# Patient Record
Sex: Male | Born: 1975
Health system: Southern US, Community
[De-identification: ages and names within clinical notes are randomized; demographics above are authoritative.]

## PROBLEM LIST (undated history)

## (undated) DIAGNOSIS — M549 Dorsalgia, unspecified: Secondary | ICD-10-CM

## (undated) DIAGNOSIS — J40 Bronchitis, not specified as acute or chronic: Secondary | ICD-10-CM

## (undated) DIAGNOSIS — J329 Chronic sinusitis, unspecified: Secondary | ICD-10-CM

---

## 2004-10-27 ENCOUNTER — Emergency Department (HOSPITAL_COMMUNITY): Admission: EM | Admit: 2004-10-27 | Discharge: 2004-10-27 | Payer: Self-pay | Admitting: Emergency Medicine

## 2005-08-09 ENCOUNTER — Emergency Department (HOSPITAL_COMMUNITY): Admission: EM | Admit: 2005-08-09 | Discharge: 2005-08-09 | Payer: Self-pay | Admitting: Emergency Medicine

## 2006-02-08 ENCOUNTER — Emergency Department (HOSPITAL_COMMUNITY): Admission: EM | Admit: 2006-02-08 | Discharge: 2006-02-08 | Payer: Self-pay | Admitting: Emergency Medicine

## 2007-06-14 ENCOUNTER — Emergency Department (HOSPITAL_COMMUNITY): Admission: EM | Admit: 2007-06-14 | Discharge: 2007-06-14 | Payer: Self-pay | Admitting: Emergency Medicine

## 2007-08-08 ENCOUNTER — Emergency Department (HOSPITAL_COMMUNITY): Admission: EM | Admit: 2007-08-08 | Discharge: 2007-08-08 | Payer: Self-pay | Admitting: Emergency Medicine

## 2008-01-20 ENCOUNTER — Emergency Department (HOSPITAL_COMMUNITY): Admission: EM | Admit: 2008-01-20 | Discharge: 2008-01-20 | Payer: Self-pay | Admitting: Emergency Medicine

## 2008-02-03 ENCOUNTER — Emergency Department (HOSPITAL_COMMUNITY): Admission: EM | Admit: 2008-02-03 | Discharge: 2008-02-03 | Payer: Self-pay | Admitting: Emergency Medicine

## 2008-02-10 ENCOUNTER — Emergency Department (HOSPITAL_COMMUNITY): Admission: EM | Admit: 2008-02-10 | Discharge: 2008-02-10 | Payer: Self-pay | Admitting: Emergency Medicine

## 2008-02-11 ENCOUNTER — Emergency Department (HOSPITAL_COMMUNITY): Admission: EM | Admit: 2008-02-11 | Discharge: 2008-02-11 | Payer: Self-pay | Admitting: Emergency Medicine

## 2008-02-21 ENCOUNTER — Emergency Department (HOSPITAL_COMMUNITY): Admission: EM | Admit: 2008-02-21 | Discharge: 2008-02-21 | Payer: Self-pay | Admitting: Emergency Medicine

## 2008-04-27 ENCOUNTER — Emergency Department (HOSPITAL_COMMUNITY): Admission: EM | Admit: 2008-04-27 | Discharge: 2008-04-27 | Payer: Self-pay | Admitting: Emergency Medicine

## 2008-06-14 ENCOUNTER — Emergency Department (HOSPITAL_COMMUNITY): Admission: EM | Admit: 2008-06-14 | Discharge: 2008-06-14 | Payer: Self-pay | Admitting: Emergency Medicine

## 2008-07-02 ENCOUNTER — Emergency Department (HOSPITAL_COMMUNITY): Admission: EM | Admit: 2008-07-02 | Discharge: 2008-07-02 | Payer: Self-pay | Admitting: Emergency Medicine

## 2008-09-07 ENCOUNTER — Emergency Department (HOSPITAL_COMMUNITY): Admission: EM | Admit: 2008-09-07 | Discharge: 2008-09-07 | Payer: Self-pay | Admitting: Emergency Medicine

## 2008-11-28 ENCOUNTER — Emergency Department (HOSPITAL_COMMUNITY): Admission: EM | Admit: 2008-11-28 | Discharge: 2008-11-28 | Payer: Self-pay | Admitting: Emergency Medicine

## 2008-12-01 ENCOUNTER — Emergency Department (HOSPITAL_COMMUNITY): Admission: EM | Admit: 2008-12-01 | Discharge: 2008-12-01 | Payer: Self-pay | Admitting: Emergency Medicine

## 2008-12-14 ENCOUNTER — Emergency Department (HOSPITAL_BASED_OUTPATIENT_CLINIC_OR_DEPARTMENT_OTHER): Admission: EM | Admit: 2008-12-14 | Discharge: 2008-12-14 | Payer: Self-pay | Admitting: Emergency Medicine

## 2009-02-12 ENCOUNTER — Emergency Department (HOSPITAL_COMMUNITY): Admission: EM | Admit: 2009-02-12 | Discharge: 2009-02-12 | Payer: Self-pay | Admitting: Emergency Medicine

## 2009-05-15 ENCOUNTER — Emergency Department (HOSPITAL_BASED_OUTPATIENT_CLINIC_OR_DEPARTMENT_OTHER): Admission: EM | Admit: 2009-05-15 | Discharge: 2009-05-16 | Payer: Self-pay | Admitting: Emergency Medicine

## 2009-05-15 ENCOUNTER — Ambulatory Visit: Payer: Self-pay | Admitting: Diagnostic Radiology

## 2009-05-16 ENCOUNTER — Ambulatory Visit: Payer: Self-pay | Admitting: Diagnostic Radiology

## 2009-06-15 ENCOUNTER — Ambulatory Visit: Payer: Self-pay | Admitting: Diagnostic Radiology

## 2009-06-15 ENCOUNTER — Emergency Department (HOSPITAL_BASED_OUTPATIENT_CLINIC_OR_DEPARTMENT_OTHER): Admission: EM | Admit: 2009-06-15 | Discharge: 2009-06-15 | Payer: Self-pay | Admitting: Emergency Medicine

## 2009-09-10 ENCOUNTER — Emergency Department (HOSPITAL_BASED_OUTPATIENT_CLINIC_OR_DEPARTMENT_OTHER): Admission: EM | Admit: 2009-09-10 | Discharge: 2009-09-10 | Payer: Self-pay | Admitting: Emergency Medicine

## 2009-09-12 ENCOUNTER — Emergency Department (HOSPITAL_COMMUNITY): Admission: EM | Admit: 2009-09-12 | Discharge: 2009-09-12 | Payer: Self-pay | Admitting: Emergency Medicine

## 2009-09-26 ENCOUNTER — Ambulatory Visit: Payer: Self-pay | Admitting: Diagnostic Radiology

## 2009-09-26 ENCOUNTER — Emergency Department (HOSPITAL_BASED_OUTPATIENT_CLINIC_OR_DEPARTMENT_OTHER): Admission: EM | Admit: 2009-09-26 | Discharge: 2009-09-26 | Payer: Self-pay | Admitting: Emergency Medicine

## 2009-10-12 ENCOUNTER — Emergency Department (HOSPITAL_COMMUNITY): Admission: EM | Admit: 2009-10-12 | Discharge: 2009-10-12 | Payer: Self-pay | Admitting: Family Medicine

## 2009-11-20 IMAGING — CR DG CHEST 2V
2 series · 2 of 2 positions shown · non-contrast
Comparison: Chest x-ray of 08/08/2007

CLINICAL DATA: Cough, fever, congestion

CHEST - 2 VIEW

[w chest pa]
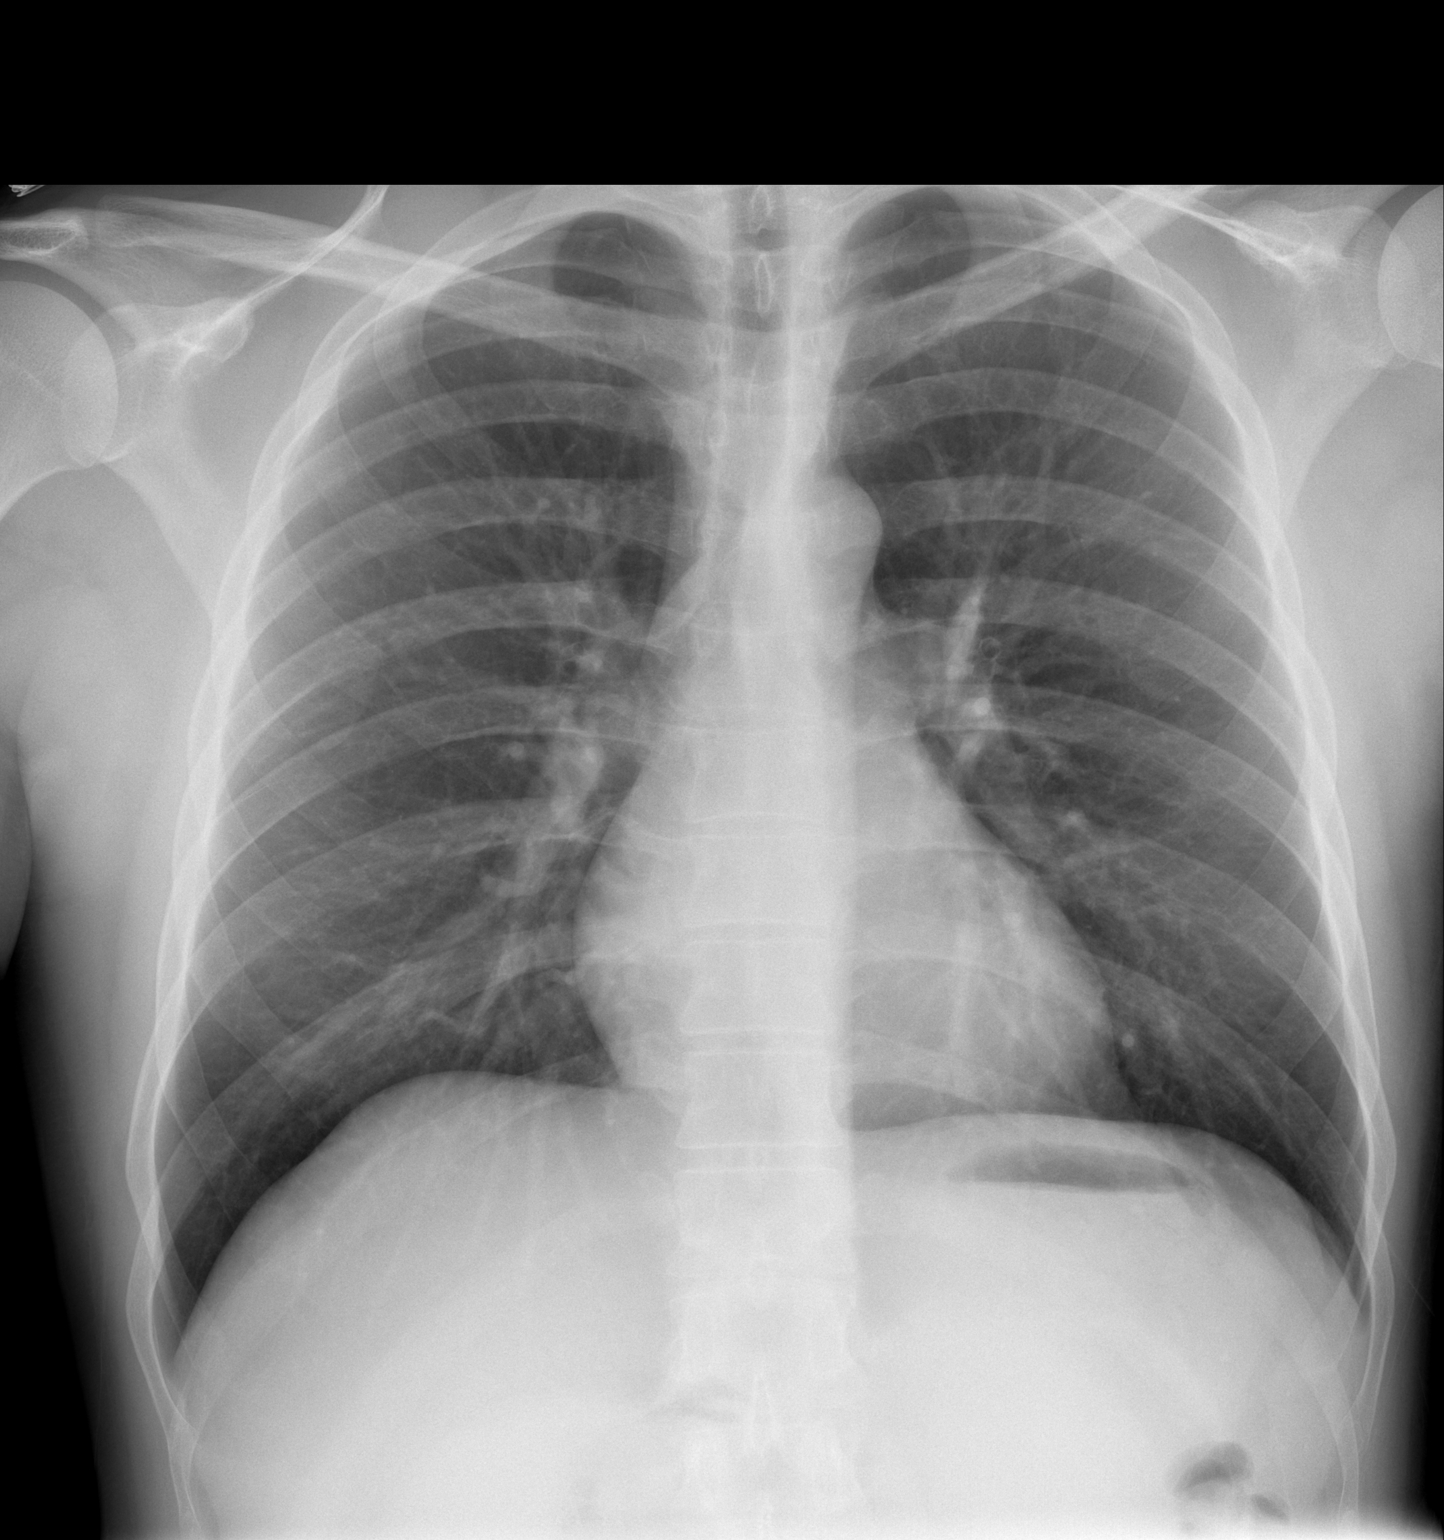

[w chest lat]
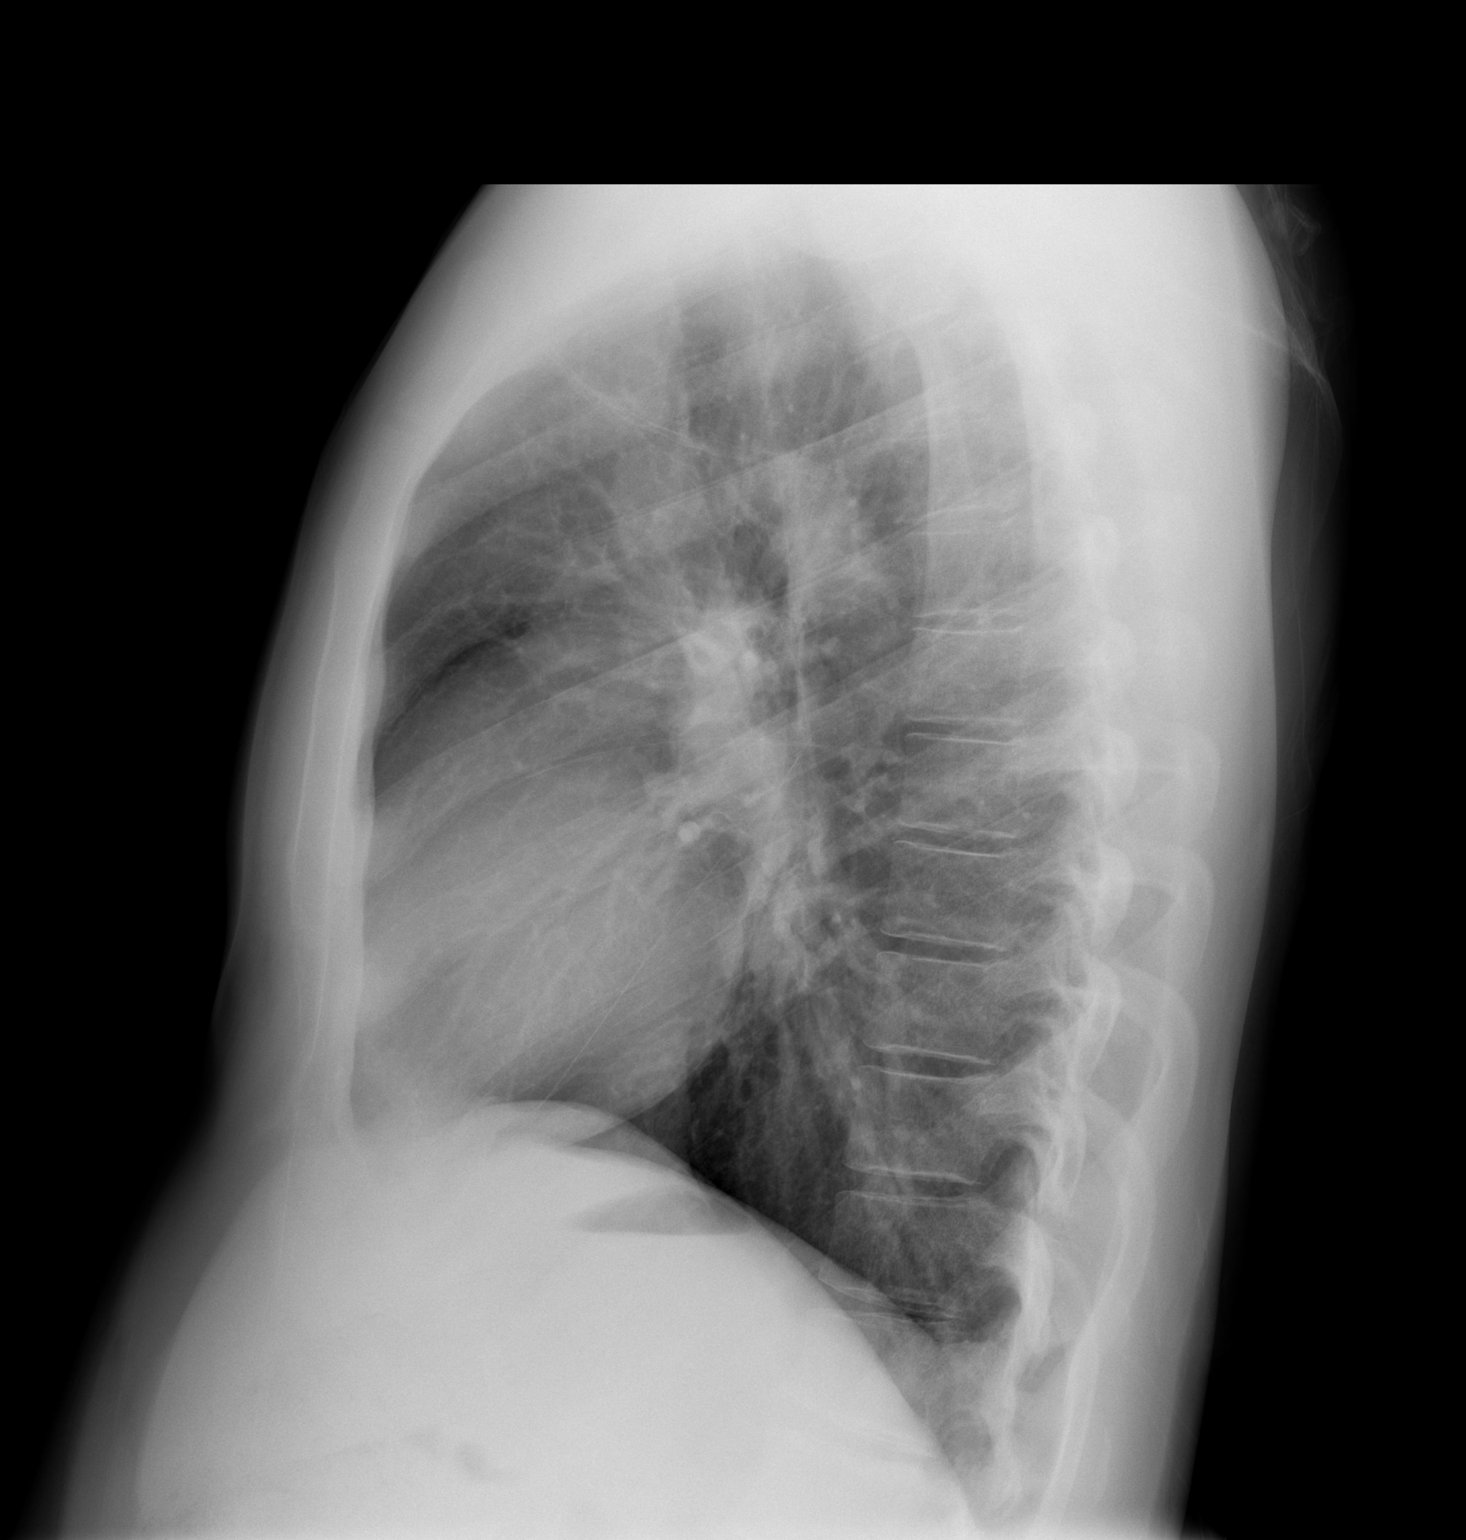

[2 of 2 positions shown; findings below may reference images not displayed]

FINDINGS: The lungs are clear and slightly hyperaerated.  The heart
is within normal limits in size.  No acute bony abnormality is
seen.
IMPRESSION: Stable chest x-ray.  No active lung disease.

## 2010-05-20 ENCOUNTER — Emergency Department (HOSPITAL_BASED_OUTPATIENT_CLINIC_OR_DEPARTMENT_OTHER): Admission: EM | Admit: 2010-05-20 | Discharge: 2010-05-20 | Payer: Self-pay | Admitting: Emergency Medicine

## 2011-01-28 LAB — RAPID STREP SCREEN (MED CTR MEBANE ONLY): Streptococcus, Group A Screen (Direct): NEGATIVE

## 2011-02-01 LAB — URINALYSIS, ROUTINE W REFLEX MICROSCOPIC
Bilirubin Urine: NEGATIVE
Glucose, UA: NEGATIVE mg/dL
Hgb urine dipstick: NEGATIVE
Ketones, ur: NEGATIVE mg/dL
Nitrite: NEGATIVE
Protein, ur: NEGATIVE mg/dL
Specific Gravity, Urine: 1.02 (ref 1.005–1.030)
Urobilinogen, UA: 0.2 mg/dL (ref 0.0–1.0)
pH: 6 (ref 5.0–8.0)

## 2011-02-02 LAB — BASIC METABOLIC PANEL
BUN: 11 mg/dL (ref 6–23)
CO2: 30 mEq/L (ref 19–32)
Calcium: 9.3 mg/dL (ref 8.4–10.5)
Chloride: 104 mEq/L (ref 96–112)
Creatinine, Ser: 0.7 mg/dL (ref 0.4–1.5)
GFR calc Af Amer: >60 mL/min (ref >60)
GFR calc non Af Amer: >60 mL/min (ref >60)
Glucose, Bld: 93 mg/dL (ref 70–99)
Potassium: 4 mEq/L (ref 3.5–5.1)
Sodium: 142 mEq/L (ref 135–145)

## 2011-02-02 LAB — URINALYSIS, ROUTINE W REFLEX MICROSCOPIC
Bilirubin Urine: NEGATIVE
Glucose, UA: NEGATIVE mg/dL
Hgb urine dipstick: NEGATIVE
Ketones, ur: NEGATIVE mg/dL
Nitrite: NEGATIVE
Protein, ur: NEGATIVE mg/dL
Specific Gravity, Urine: 1.011 (ref 1.005–1.030)
Urobilinogen, UA: 0.2 mg/dL (ref 0.0–1.0)
pH: 7 (ref 5.0–8.0)

## 2011-02-05 LAB — COMPREHENSIVE METABOLIC PANEL
ALT: 16 U/L (ref 0–53)
AST: 20 U/L (ref 0–37)
Albumin: 4.3 g/dL (ref 3.5–5.2)
Alkaline Phosphatase: 73 U/L (ref 39–117)
BUN: 11 mg/dL (ref 6–23)
CO2: 29 mEq/L (ref 19–32)
Calcium: 9.6 mg/dL (ref 8.4–10.5)
Chloride: 107 mEq/L (ref 96–112)
Creatinine, Ser: 0.74 mg/dL (ref 0.4–1.5)
GFR calc Af Amer: >60 mL/min (ref >60)
GFR calc non Af Amer: >60 mL/min (ref >60)
Glucose, Bld: 98 mg/dL (ref 70–99)
Potassium: 3.8 mEq/L (ref 3.5–5.1)
Sodium: 141 mEq/L (ref 135–145)
Total Bilirubin: 0.5 mg/dL (ref 0.3–1.2)
Total Protein: 6.8 g/dL (ref 6.0–8.3)

## 2011-02-05 LAB — URINALYSIS, ROUTINE W REFLEX MICROSCOPIC
Bilirubin Urine: NEGATIVE
Glucose, UA: NEGATIVE mg/dL
Hgb urine dipstick: NEGATIVE
Ketones, ur: NEGATIVE mg/dL
Nitrite: NEGATIVE
Protein, ur: NEGATIVE mg/dL
Specific Gravity, Urine: 1.025 (ref 1.005–1.030)
Urobilinogen, UA: 1 mg/dL (ref 0.0–1.0)
pH: 7 (ref 5.0–8.0)

## 2011-02-05 LAB — DIFFERENTIAL
Basophils Absolute: 0.2 10*3/uL — ABNORMAL HIGH (ref 0.0–0.1)
Basophils Relative: 2 % — ABNORMAL HIGH (ref 0–1)
Eosinophils Absolute: 0.2 10*3/uL (ref 0.0–0.7)
Eosinophils Relative: 3 % (ref 0–5)
Lymphocytes Relative: 29 % (ref 12–46)
Lymphs Abs: 2.2 10*3/uL (ref 0.7–4.0)
Monocytes Absolute: 0.4 10*3/uL (ref 0.1–1.0)
Monocytes Relative: 6 % (ref 3–12)
Neutro Abs: 4.7 10*3/uL (ref 1.7–7.7)
Neutrophils Relative %: 60 % (ref 43–77)

## 2011-02-05 LAB — LIPASE, BLOOD: Lipase: 20 U/L (ref 11–59)

## 2011-02-05 LAB — CBC
HCT: 39.5 % (ref 39.0–52.0)
Hemoglobin: 14 g/dL (ref 13.0–17.0)
MCHC: 35.4 g/dL (ref 30.0–36.0)
MCV: 84.8 fL (ref 78.0–100.0)
Platelets: 179 10*3/uL (ref 150–400)
RBC: 4.66 MIL/uL (ref 4.22–5.81)
RDW: 13.3 % (ref 11.5–15.5)
WBC: 7.7 10*3/uL (ref 4.0–10.5)

## 2011-07-22 LAB — URINALYSIS, ROUTINE W REFLEX MICROSCOPIC
Bilirubin Urine: NEGATIVE
Bilirubin Urine: NEGATIVE
Bilirubin Urine: NEGATIVE
Glucose, UA: NEGATIVE
Glucose, UA: NEGATIVE
Hgb urine dipstick: NEGATIVE
Hgb urine dipstick: NEGATIVE
Ketones, ur: NEGATIVE
Ketones, ur: NEGATIVE
Ketones, ur: NEGATIVE
Nitrite: NEGATIVE
Nitrite: NEGATIVE
Nitrite: NEGATIVE
Protein, ur: NEGATIVE
Protein, ur: NEGATIVE
Protein, ur: NEGATIVE
Specific Gravity, Urine: 1.012
Specific Gravity, Urine: 1.009
Specific Gravity, Urine: 1.016
Urobilinogen, UA: 1
Urobilinogen, UA: 1
Urobilinogen, UA: 1
pH: 6
pH: 7

## 2011-07-29 LAB — BASIC METABOLIC PANEL
BUN: 8
Calcium: 9.6
GFR calc non Af Amer: >60
Glucose, Bld: 99

## 2011-07-29 LAB — CBC
MCHC: 33.8
Platelets: 247
RDW: 13.7

## 2011-07-29 LAB — DIFFERENTIAL
Basophils Absolute: 0
Basophils Relative: 0
Eosinophils Relative: 2
Lymphocytes Relative: 29
Neutro Abs: 5.3

## 2011-07-30 LAB — OCCULT BLOOD X 1 CARD TO LAB, STOOL: Fecal Occult Bld: POSITIVE

## 2011-11-03 ENCOUNTER — Encounter: Payer: Self-pay | Admitting: *Deleted

## 2011-11-03 ENCOUNTER — Emergency Department (HOSPITAL_BASED_OUTPATIENT_CLINIC_OR_DEPARTMENT_OTHER)
Admission: EM | Admit: 2011-11-03 | Discharge: 2011-11-03 | Disposition: A | Discharge status: Home / Self Care | Payer: Self-pay | Attending: Emergency Medicine | Admitting: Emergency Medicine

## 2011-11-03 DIAGNOSIS — R609 Edema, unspecified: Secondary | ICD-10-CM | POA: Insufficient documentation

## 2011-11-03 DIAGNOSIS — M549 Dorsalgia, unspecified: Secondary | ICD-10-CM | POA: Insufficient documentation

## 2011-11-03 DIAGNOSIS — R300 Dysuria: Secondary | ICD-10-CM | POA: Insufficient documentation

## 2011-11-03 LAB — URINALYSIS, ROUTINE W REFLEX MICROSCOPIC
Hgb urine dipstick: NEGATIVE
Ketones, ur: NEGATIVE mg/dL
Protein, ur: NEGATIVE mg/dL
Urobilinogen, UA: 0.2 mg/dL (ref 0.0–1.0)

## 2011-11-03 MED ORDER — ONDANSETRON HCL 4 MG/2ML IJ SOLN
4.0000 mg | Freq: Once | INTRAMUSCULAR | Status: AC
  Administered 2011-11-03: 4 mg via INTRAMUSCULAR
  Filled 2011-11-03: qty 2

## 2011-11-03 MED ORDER — HYDROMORPHONE HCL PF 2 MG/ML IJ SOLN
2.0000 mg | Freq: Once | INTRAMUSCULAR | Status: AC
  Administered 2011-11-03: 2 mg via INTRAMUSCULAR
  Filled 2011-11-03: qty 1

## 2011-11-03 MED ORDER — OXYCODONE-ACETAMINOPHEN 5-325 MG PO TABS: 2.0000 | ORAL_TABLET | ORAL | Status: AC | PRN

## 2011-11-03 MED ORDER — PREDNISONE 10 MG PO TABS: ORAL_TABLET | ORAL | Status: DC

## 2011-11-03 MED ORDER — KETOROLAC TROMETHAMINE 60 MG/2ML IM SOLN
60.0000 mg | Freq: Once | INTRAMUSCULAR | Status: AC
  Administered 2011-11-03: 60 mg via INTRAMUSCULAR
  Filled 2011-11-03: qty 2

## 2011-11-03 NOTE — ED Notes (Signed)
C/o left side back pain radiating to lower abd- painful urination- sx x 1 day

## 2011-11-03 NOTE — ED Provider Notes (Signed)
History     CSN: [id number]  Arrival date & time 11/03/11  1556   None     Chief Complaint  Patient presents with  . Back Pain    (Consider location/radiation/quality/duration/timing/severity/associated sxs/prior treatment) Patient is a 37 y.o. male presenting with back pain. The history is provided by the patient. No language interpreter was used.  Back Pain  This is a new problem. The current episode started yesterday. The problem occurs constantly. The problem has been gradually worsening. The pain is associated with no known injury. The pain is present in the lumbar spine. The quality of the pain is described as stabbing. The pain radiates to the left thigh and right thigh. The pain is at a severity of 6/10. The pain is moderate. The symptoms are aggravated by bending. The pain is the same all the time. Stiffness is present all day. Associated symptoms include dysuria and pelvic pain. He has tried NSAIDs for the symptoms. The treatment provided no relief. Risk factors: hx of similiar.    No past medical history on file.  No past surgical history on file.  No family history on file.  History  Substance Use Topics  . Smoking status: Not on file  . Smokeless tobacco: Not on file  . Alcohol Use: Not on file      Review of Systems  Genitourinary: Positive for dysuria and pelvic pain.  Musculoskeletal: Positive for back pain.  All other systems reviewed and are negative.    Allergies  Review of patient's allergies indicates no known allergies.  Home Medications   Current Outpatient Rx  Name Route Sig Dispense Refill  . IBUPROFEN 200 MG PO TABS Oral Take 600 mg by mouth every 6 (six) hours as needed. For pain       BP 125/76  Pulse 66  Temp(Src) 99.3 F (37.4 C) (Oral)  Resp 18  Wt 192 lb (87.091 kg)  SpO2 99%  Physical Exam  Nursing note and vitals reviewed. Constitutional: He is oriented to person, place, and time.  HENT:  Head: Normocephalic and  atraumatic.  Eyes: Conjunctivae are normal. Pupils are equal, round, and reactive to light.  Neck: Normal range of motion. Neck supple.  Cardiovascular: Normal rate and normal heart sounds.   Pulmonary/Chest: Effort normal.  Abdominal: Soft.  Musculoskeletal: He exhibits edema and tenderness.       Diffusely tender ls spine decreased range of motion,   Neurological: He is alert and oriented to person, place, and time. He has normal reflexes.  Skin: Skin is warm.  Psychiatric: He has a normal mood and affect.    ED Course  Procedures (including critical care time)   Labs Reviewed  URINALYSIS, ROUTINE W REFLEX MICROSCOPIC   No results found.   No diagnosis found.    MDM   Results for orders placed during the hospital encounter of 11/03/11  URINALYSIS, ROUTINE W REFLEX MICROSCOPIC      Component Value Range   Color, Urine YELLOW  YELLOW    APPearance CLEAR  CLEAR    Specific Gravity, Urine 1.018  1.005 - 1.030    pH 7.0  5.0 - 8.0    Glucose, UA NEGATIVE  NEGATIVE (mg/dL)   Hgb urine dipstick NEGATIVE  NEGATIVE    Bilirubin Urine NEGATIVE  NEGATIVE    Ketones, ur NEGATIVE  NEGATIVE (mg/dL)   Protein, ur NEGATIVE  NEGATIVE (mg/dL)   Urobilinogen, UA 0.2  0.0 - 1.0 (mg/dL)   Nitrite NEGATIVE  NEGATIVE  Leukocytes, UA NEGATIVE  NEGATIVE    No results found.         Langston Masker, [place] 11/03/11 1811

## 2011-11-04 NOTE — ED Provider Notes (Signed)
Medical screening examination/treatment/procedure(s) were performed by non-physician practitioner and as supervising physician I was immediately available for consultation/collaboration.   Tarik Teixeira, MD 11/04/11 1233 

## 2012-08-23 ENCOUNTER — Encounter (HOSPITAL_COMMUNITY): Payer: Self-pay | Admitting: Emergency Medicine

## 2012-08-23 ENCOUNTER — Emergency Department (HOSPITAL_COMMUNITY): Payer: Self-pay

## 2012-08-23 ENCOUNTER — Emergency Department (HOSPITAL_COMMUNITY)
Admission: EM | Admit: 2012-08-23 | Discharge: 2012-08-23 | Disposition: A | Discharge status: Home / Self Care | Payer: Self-pay | Attending: Emergency Medicine | Admitting: Emergency Medicine

## 2012-08-23 DIAGNOSIS — J069 Acute upper respiratory infection, unspecified: Secondary | ICD-10-CM | POA: Insufficient documentation

## 2012-08-23 DIAGNOSIS — F17200 Nicotine dependence, unspecified, uncomplicated: Secondary | ICD-10-CM | POA: Insufficient documentation

## 2012-08-23 LAB — RAPID STREP SCREEN (MED CTR MEBANE ONLY): Streptococcus, Group A Screen (Direct): NEGATIVE

## 2012-08-23 MED ORDER — AZITHROMYCIN 250 MG PO TABS: ORAL_TABLET | ORAL | Status: DC

## 2012-08-23 MED ORDER — ALBUTEROL SULFATE HFA 108 (90 BASE) MCG/ACT IN AERS
2.0000 | INHALATION_SPRAY | Freq: Once | RESPIRATORY_TRACT | Status: AC
  Administered 2012-08-23: 2 via RESPIRATORY_TRACT
  Filled 2012-08-23: qty 6.7

## 2012-08-23 NOTE — ED Notes (Signed)
Pt verbalizes understanding 

## 2012-08-23 NOTE — ED Notes (Signed)
Pt presenting to ed with c/o cold symptoms pt states cough with yellowish colored sputum. Pt states positive sore throat. Pt states congestion and body aches. Pt denies nausea and vomiting. Pt states chest pain with cough only.

## 2012-08-23 NOTE — ED Provider Notes (Signed)
History     CSN: [id number]  Arrival date & time 08/23/12  1628   First MD Initiated Contact with Patient 08/23/12 1730      Chief Complaint  Patient presents with  . Cough  . Sore Throat    (Consider location/radiation/quality/duration/timing/severity/associated sxs/prior treatment) HPI Comments: Patient presents with complaint of productive cough and yellow sputum X 4 days. Patient is an occasional smoker. Of note wife has similar symptoms. Denies fever or chills. Denies SOB, wheezing, or congestion.   The history is provided by the patient. No language interpreter was used.    History reviewed. No pertinent past medical history.  History reviewed. No pertinent past surgical history.  No family history on file.  History  Substance Use Topics  . Smoking status: Current Some Day Smoker    Types: Cigarettes  . Smokeless tobacco: Not on file  . Alcohol Use: No      Review of Systems  Constitutional: Negative for fever.  HENT: Negative for congestion.   Respiratory: Positive for cough. Negative for shortness of breath and wheezing.     Allergies  Review of patient's allergies indicates no known allergies.  Home Medications   Current Outpatient Rx  Name Route Sig Dispense Refill  . GUAIFENESIN-DM 100-10 MG/5ML PO SYRP Oral Take 10 mLs by mouth 3 (three) times daily as needed. Cough    . IBUPROFEN 200 MG PO TABS Oral Take 600 mg by mouth every 6 (six) hours as needed. For pain    . AZITHROMYCIN 250 MG PO TABS  2 po day one, then 1 daily x 4 days 5 tablet 0    BP 131/73  Pulse 62  Temp 98.7 F (37.1 C) (Oral)  Resp 20  SpO2 99%  Physical Exam  Nursing note and vitals reviewed. Constitutional: He appears well-developed and well-nourished.  HENT:  Head: Normocephalic and atraumatic.  Mouth/Throat: Oropharynx is clear and moist.       Mildly injected oropharynx with out exudates.   Eyes: Conjunctivae normal and EOM are normal. No scleral icterus.    Cardiovascular: Normal rate, regular rhythm and normal heart sounds.   Pulmonary/Chest: Effort normal and breath sounds normal. He has no wheezes.  Abdominal: Soft. Bowel sounds are normal. There is no tenderness.  Neurological: He is alert.  Skin: Skin is warm and dry.    ED Course  Procedures (including critical care time)   Labs Reviewed  RAPID STREP SCREEN   Results for orders placed during the hospital encounter of 08/23/12  RAPID STREP SCREEN      Component Value Range   Streptococcus, Group A Screen (Direct) NEGATIVE  NEGATIVE    Dg Chest 2 View  08/23/2012  *RADIOLOGY REPORT*  Clinical Data: Cough and congestion  CHEST - 2 VIEW  Comparison:  09/26/2009  Findings:  The heart size and mediastinal contours are within normal limits.  Both lungs are clear.  The visualized skeletal structures are unremarkable.  IMPRESSION: No active cardiopulmonary disease.   Original Report Authenticated By: Camelia Phenes, M.D.      1. URI (upper respiratory infection)       MDM  Patient presented with URI symptoms. CXR and rapid strep obtained before evaluation. Both tests unremarkable. Patient given albuterol inhaler and Z-pak for comfort. Discharged with return precautions. No red flags for PNA or pneumothorax.        Pixie Casino, PA-C 08/23/12 [phone]

## 2012-08-23 NOTE — Progress Notes (Signed)
CM spoke with pt who confirms self pay [facility] resident with no pcp nor coverage. Discussed the importance of a pcp for f/u. Reviewed Health connect number to assist with finding self pay provider close to pt's residence. Reviewed and provided written resources for [facility], general medical clinics, [email or web address], [facility] out patient [facility]ies, housing,  DSS, health Department and other resources in [employer]. Pt voiced understanding and appreciation of resources provided

## 2012-08-23 NOTE — ED Provider Notes (Signed)
Medical screening examination/treatment/procedure(s) were performed by non-physician practitioner and as supervising physician I was immediately available for consultation/collaboration.   Oktober Glazer, MD 08/23/12 2336 

## 2012-12-23 ENCOUNTER — Encounter (HOSPITAL_COMMUNITY): Payer: Self-pay | Admitting: Emergency Medicine

## 2012-12-23 ENCOUNTER — Emergency Department (HOSPITAL_COMMUNITY)
Admission: EM | Admit: 2012-12-23 | Discharge: 2012-12-23 | Disposition: A | Discharge status: Home / Self Care | Payer: Self-pay | Attending: Emergency Medicine | Admitting: Emergency Medicine

## 2012-12-23 DIAGNOSIS — M549 Dorsalgia, unspecified: Secondary | ICD-10-CM

## 2012-12-23 DIAGNOSIS — G8929 Other chronic pain: Secondary | ICD-10-CM | POA: Insufficient documentation

## 2012-12-23 DIAGNOSIS — Z87891 Personal history of nicotine dependence: Secondary | ICD-10-CM | POA: Insufficient documentation

## 2012-12-23 DIAGNOSIS — M545 Low back pain, unspecified: Secondary | ICD-10-CM | POA: Insufficient documentation

## 2012-12-23 DIAGNOSIS — Z79899 Other long term (current) drug therapy: Secondary | ICD-10-CM | POA: Insufficient documentation

## 2012-12-23 MED ORDER — CYCLOBENZAPRINE HCL 10 MG PO TABS
10.0000 mg | ORAL_TABLET | Freq: Once | ORAL | Status: AC
  Administered 2012-12-23: 10 mg via ORAL
  Filled 2012-12-23: qty 1

## 2012-12-23 MED ORDER — HYDROCODONE-ACETAMINOPHEN 5-325 MG PO TABS: 1.0000 | ORAL_TABLET | ORAL | Status: DC | PRN

## 2012-12-23 MED ORDER — CYCLOBENZAPRINE HCL 10 MG PO TABS: 10.0000 mg | ORAL_TABLET | Freq: Two times a day (BID) | ORAL | Status: DC | PRN

## 2012-12-23 MED ORDER — PREDNISONE 20 MG PO TABS
60.0000 mg | ORAL_TABLET | Freq: Once | ORAL | Status: AC
  Administered 2012-12-23: 60 mg via ORAL
  Filled 2012-12-23: qty 3

## 2012-12-23 MED ORDER — PREDNISONE 20 MG PO TABS: ORAL_TABLET | ORAL | Status: DC

## 2012-12-23 NOTE — ED Notes (Signed)
Pt presenting to ed with c/o history of chronic back pain with worsening back pain since Tuesday. Pt denies injury

## 2012-12-23 NOTE — ED Provider Notes (Signed)
History     CSN: [id number]  Arrival date & time 12/23/12  1706   First MD Initiated Contact with Patient 12/23/12 1721      Chief Complaint  Patient presents with  . Back Pain    (Consider location/radiation/quality/duration/timing/severity/associated sxs/prior treatment) HPI Comments: 37 y/o Hispanic male with a history of chronic back pain presents to the ED with his wife who is translating for him complaining of worsening back pain x 3 days. No recent injury or trauma. Pain located in lower back worse on right side radiating down his right leg. Describes the pain as dull with occasional sharp pains rated 10/10. He tried multiple OTC medications without relief. Denies numbness or tingling down his extremities, loss of control of bowels or bladder or any saddle anesthesia. Currently does not have a PCP. Wife states he does do a lot of heavy lifting with his job.  The history is provided by the patient and the spouse. The history is limited by a language barrier.    History reviewed. No pertinent past medical history.  History reviewed. No pertinent past surgical history.  No family history on file.  History  Substance Use Topics  . Smoking status: Former Smoker    Types: Cigarettes  . Smokeless tobacco: Not on file  . Alcohol Use: No      Review of Systems  Constitutional: Negative for fever, chills and activity change.  Musculoskeletal: Positive for back pain.  Neurological: Negative for weakness and numbness.  All other systems reviewed and are negative.    Allergies  Review of patient's allergies indicates no known allergies.  Home Medications   Current Outpatient Rx  Name  Route  Sig  Dispense  Refill  . Aspirin-Caffeine (BAYER BACK & BODY PAIN EX ST) 500-32.5 MG TABS   Oral   Take 1 tablet by mouth as needed. Body pain         . Menthol-Methyl Salicylate (ICY HOT EXTRA STRENGTH) 10-30 % CREA   Apply externally   Apply 1 application topically as  needed. Muscle Pain         . cyclobenzaprine (FLEXERIL) 10 MG tablet   Oral   Take 1 tablet (10 mg total) by mouth 2 (two) times daily as needed for muscle spasms.   20 tablet   0   . HYDROcodone-acetaminophen (NORCO/VICODIN) 5-325 MG per tablet   Oral   Take 1-2 tablets by mouth every 4 (four) hours as needed for pain.   6 tablet   0   . predniSONE (DELTASONE) 20 MG tablet      Take 3 tabs PO x 2 days followed by 2 tabs PO x 2 days followed by 1 tab PO x 2 days   12 tablet   0     BP 131/73  Pulse 63  Temp(Src) 98.1 F (36.7 C) (Oral)  Resp 20  SpO2 98%  Physical Exam  Nursing note and vitals reviewed. Constitutional: He is oriented to person, place, and time. He appears well-developed and well-nourished. No distress.  HENT:  Head: Normocephalic and atraumatic.  Mouth/Throat: Oropharynx is clear and moist.  Eyes: Conjunctivae are normal.  Neck: Normal range of motion. Neck supple.  Cardiovascular: Normal rate, regular rhythm, normal heart sounds and intact distal pulses.   Pulmonary/Chest: Effort normal and breath sounds normal.  Abdominal: Soft. Bowel sounds are normal. There is no tenderness.  Musculoskeletal: Normal range of motion. He exhibits no edema.       Lumbar back:  He exhibits tenderness (right paralumbar muscles with spasm) and spasm. He exhibits no bony tenderness, no swelling, no edema and normal pulse.  Neurological: He is alert and oriented to person, place, and time. He has normal strength. No sensory deficit. Gait normal.  Skin: Skin is warm and dry. No rash noted. No erythema.  Psychiatric: He has a normal mood and affect. His behavior is normal.    ED Course  Procedures (including critical care time)  Labs Reviewed - No data to display No results found.   1. Back pain   2. Chronic back pain       MDM  Acute on chronic back pain. No red flags concerning patient's back pain. No s/s of central cord compression or cauda equina. Lower  extremities are neurovascularly intact and patient is ambulating without difficulty. Rx flexeril, norco #6, prednisone. Return precautions discussed. Resource guide given for PCP f/u. Patient and wife state understanding of plan and are agreeable.         Trevor Mace, PA-C 12/23/12 1824

## 2012-12-23 NOTE — ED Provider Notes (Signed)
Medical screening examination/treatment/procedure(s) were performed by non-physician practitioner and as supervising physician I was immediately available for consultation/collaboration. Devoria Albe, MD, FACEP   Ward Givens, MD 12/23/12 [phone]

## 2013-09-12 ENCOUNTER — Emergency Department (HOSPITAL_COMMUNITY): Payer: Self-pay

## 2013-09-12 ENCOUNTER — Encounter (HOSPITAL_COMMUNITY): Payer: Self-pay | Admitting: Emergency Medicine

## 2013-09-12 ENCOUNTER — Emergency Department (HOSPITAL_COMMUNITY)
Admission: EM | Admit: 2013-09-12 | Discharge: 2013-09-12 | Disposition: A | Discharge status: Home / Self Care | Payer: Self-pay | Attending: Emergency Medicine | Admitting: Emergency Medicine

## 2013-09-12 DIAGNOSIS — Z87891 Personal history of nicotine dependence: Secondary | ICD-10-CM | POA: Insufficient documentation

## 2013-09-12 DIAGNOSIS — J069 Acute upper respiratory infection, unspecified: Secondary | ICD-10-CM | POA: Insufficient documentation

## 2013-09-12 DIAGNOSIS — R0602 Shortness of breath: Secondary | ICD-10-CM | POA: Insufficient documentation

## 2013-09-12 HISTORY — DX: Bronchitis, not specified as acute or chronic: J40

## 2013-09-12 NOTE — Progress Notes (Signed)
[facility] provided pt with a [facility] [employer] Card application.  °

## 2013-09-12 NOTE — ED Provider Notes (Signed)
CSN: [id number]     Arrival date & time 09/12/13  0910 History   First MD Initiated Contact with Patient 09/12/13 1012     Chief Complaint  Patient presents with  . Cough  . Nasal Congestion   (Consider location/radiation/quality/duration/timing/severity/associated sxs/prior Treatment) HPI Comments: Patient presents with a chief complaint of productive cough and nasal congestion.  Symptoms have been present for the past 4 days and are gradually worsening.  He reports that he has taken Robitussin for symptoms, with mild relief.  He reports mild associated shortness of breath.  No fever or chills.  No chest pain.  Denies any other symptoms.  He denies prior history of COPD or Asthma.  He does not smoke.  Patient is a 37 y.o. male presenting with cough. The history is provided by the patient.  Cough   Past Medical History  Diagnosis Date  . Bronchitis    History reviewed. No pertinent past surgical history. History reviewed. No pertinent family history. History  Substance Use Topics  . Smoking status: Former Smoker    Types: Cigarettes  . Smokeless tobacco: Not on file  . Alcohol Use: No    Review of Systems  HENT: Positive for congestion.   Respiratory: Positive for cough.   All other systems reviewed and are negative.    Allergies  Review of patient's allergies indicates no known allergies.  Home Medications   Current Outpatient Rx  Name  Route  Sig  Dispense  Refill  . guaiFENesin-dextromethorphan (ROBITUSSIN DM) 100-10 MG/5ML syrup   Oral   Take 20 mLs by mouth every 4 (four) hours as needed for cough.         Marland Kitchen ibuprofen (ADVIL,MOTRIN) 200 MG tablet   Oral   Take 600 mg by mouth every 6 (six) hours as needed for headache or moderate pain.          BP 134/73  Pulse 63  Temp(Src) 98.8 F (37.1 C) (Oral)  Resp 20  SpO2 97% Physical Exam  Nursing note and vitals reviewed. Constitutional: He appears well-developed and well-nourished.  HENT:  Head:  Normocephalic and atraumatic.  Right Ear: Tympanic membrane and ear canal normal.  Left Ear: Tympanic membrane and ear canal normal.  Nose: Mucosal edema and rhinorrhea present.  Mouth/Throat: Uvula is midline, oropharynx is clear and moist and mucous membranes are normal.  Neck: Normal range of motion. Neck supple.  Cardiovascular: Normal rate, regular rhythm and normal heart sounds.   Pulmonary/Chest: Effort normal and breath sounds normal. No respiratory distress. He has no wheezes. He has no rales.  Neurological: He is alert.  Skin: Skin is warm and dry.  Psychiatric: He has a normal mood and affect.    ED Course  Procedures (including critical care time) Labs Review Labs Reviewed - No data to display Imaging Review Dg Chest 2 View  09/12/2013   CLINICAL DATA:  Cough.  Short of breath.  Chest pain.  EXAM: CHEST  2 VIEW  COMPARISON:  08/23/2012  FINDINGS: Heart, mediastinum and hila are normal.  Lungs are clear.  No pleural effusion or pneumothorax.  Bony thorax and soft tissues are unremarkable.  IMPRESSION: Normal chest radiographs.   Electronically Signed   By: Amie Portland M.D.   On: 09/12/2013 11:08    EKG Interpretation   None       MDM  No diagnosis found. Pt CXR negative for acute infiltrate. Patients symptoms are consistent with URI, likely viral etiology. Discussed that antibiotics are  not indicated for viral infections. Pt will be discharged with symptomatic treatment.  Verbalizes understanding and is agreeable with plan. Pt is hemodynamically stable & in NAD prior to dc.     Santiago Glad, PA-C 09/12/13 1202

## 2013-09-12 NOTE — ED Notes (Signed)
Pt c/o of cough, and nasal congestion x1 week. Denies hx of asthma. Pt does not speak [place]. Wife translate.

## 2013-09-12 NOTE — ED Provider Notes (Signed)
Medical screening examination/treatment/procedure(s) were performed by non-physician practitioner and as supervising physician I was immediately available for consultation/collaboration.  EKG Interpretation   None       Hira Trent, MD, FACEP   [place]molara Carol L Melyssa Signor, MD 09/12/13 1337 

## 2014-02-10 ENCOUNTER — Encounter (HOSPITAL_BASED_OUTPATIENT_CLINIC_OR_DEPARTMENT_OTHER): Payer: Self-pay | Admitting: Emergency Medicine

## 2014-02-10 ENCOUNTER — Emergency Department (HOSPITAL_BASED_OUTPATIENT_CLINIC_OR_DEPARTMENT_OTHER)
Admission: EM | Admit: 2014-02-10 | Discharge: 2014-02-10 | Disposition: A | Discharge status: Home / Self Care | Payer: Self-pay | Attending: Emergency Medicine | Admitting: Emergency Medicine

## 2014-02-10 DIAGNOSIS — M545 Low back pain, unspecified: Secondary | ICD-10-CM | POA: Insufficient documentation

## 2014-02-10 DIAGNOSIS — R300 Dysuria: Secondary | ICD-10-CM | POA: Insufficient documentation

## 2014-02-10 DIAGNOSIS — M549 Dorsalgia, unspecified: Secondary | ICD-10-CM

## 2014-02-10 DIAGNOSIS — R3919 Other difficulties with micturition: Secondary | ICD-10-CM | POA: Insufficient documentation

## 2014-02-10 DIAGNOSIS — F17200 Nicotine dependence, unspecified, uncomplicated: Secondary | ICD-10-CM | POA: Insufficient documentation

## 2014-02-10 LAB — URINALYSIS, ROUTINE W REFLEX MICROSCOPIC
Bilirubin Urine: NEGATIVE
GLUCOSE, UA: NEGATIVE mg/dL
Hgb urine dipstick: NEGATIVE
Ketones, ur: NEGATIVE mg/dL
LEUKOCYTES UA: NEGATIVE
Nitrite: NEGATIVE
PROTEIN: NEGATIVE mg/dL
Specific Gravity, Urine: 1.017 (ref 1.005–1.030)
UROBILINOGEN UA: 0.2 mg/dL (ref 0.0–1.0)
pH: 6.5 (ref 5.0–8.0)

## 2014-02-10 MED ORDER — HYDROCODONE-ACETAMINOPHEN 5-325 MG PO TABS: 2.0000 | ORAL_TABLET | ORAL | Status: DC | PRN

## 2014-02-10 MED ORDER — OXYCODONE-ACETAMINOPHEN 5-325 MG PO TABS
2.0000 | ORAL_TABLET | Freq: Once | ORAL | Status: AC
  Administered 2014-02-10: 2 via ORAL
  Filled 2014-02-10: qty 2

## 2014-02-10 MED ORDER — IBUPROFEN 800 MG PO TABS: 800.0000 mg | ORAL_TABLET | Freq: Three times a day (TID) | ORAL | Status: DC

## 2014-02-10 MED ORDER — IBUPROFEN 800 MG PO TABS
800.0000 mg | ORAL_TABLET | Freq: Once | ORAL | Status: AC
  Administered 2014-02-10: 800 mg via ORAL
  Filled 2014-02-10: qty 1

## 2014-02-10 NOTE — ED Provider Notes (Signed)
CSN: [id number]     Arrival date & time 02/10/14  [id number] History  This chart was scribed for Glynn OctaveStephen Lizzete Gough, MD by Shari HeritageAisha Amuda, ED Scribe. The patient was seen in room MH05/MH05. Patient's care was started at 8:05 AM.   Chief Complaint  Patient presents with  . Back Pain    The history is provided by the patient. No language interpreter was used.    HPI Comments: Parker Lambert is a 38 y.o. male who presents to the Emergency Department complaining of constant, moderate to severe, left lower back pain that began yesterday. Patient states that pain radiates down his left leg. He says that pain worsened earlier this morning prompting him to come to the ED for further evaluation. He denies recent injury or trauma to his back. 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 He has never had any back surgeries. Patient further states that he has been having pain with urination and difficulty starting his urine stream. He also reports he has occasional testicular pain, but he is not having this now. He denies numbness, weakness or tingling in the extremities. Patient further denies bowel incontinence, bladder incontinence, hematuria, cough, shortness of breath, chest pain, congestion, and rhinorrhea. Patient does not take any medications on a regular basis. He is a current smoker.  Patient does not have a PCP.  Past Medical History  Diagnosis Date  . Bronchitis   . DDD (degenerative disc disease)    History reviewed. No pertinent past surgical history. No family history on file. History  Substance Use Topics  . Smoking status: Current Some Day Smoker    Types: Cigarettes  . Smokeless tobacco: Not on file  . Alcohol Use: No    Review of Systems A complete 10 system review of systems was obtained and all systems are  negative except as noted in the HPI and PMH.    Allergies  Review of patient's allergies indicates no known allergies.  Home Medications   Prior to Admission medications   Medication Sig Start Date End Date Taking? Authorizing Provider  guaiFENesin-dextromethorphan (ROBITUSSIN DM) 100-10 MG/5ML syrup Take 20 mLs by mouth every 4 (four) hours as needed for cough.    Historical Provider, MD  ibuprofen (ADVIL,MOTRIN) 200 MG tablet Take 600 mg by mouth every 6 (six) hours as needed for headache or moderate pain.    Historical Provider, MD   Triage Vitals: BP 153/91  Pulse 63  Temp(Src) 97.8 F (36.6 C) (Oral)  Resp 16  Ht 6' (1.829 m)  Wt 175 lb (79.379 kg)  BMI 23.73 kg/m2  SpO2 97% Physical Exam  Constitutional: He is oriented to person, place, and time. He appears well-developed and well-nourished. No distress.  Uncomfortable.  HENT:  Head: Normocephalic and atraumatic.  Eyes: Conjunctivae and EOM are normal. Pupils are equal, round, and reactive to light.  Neck: Normal range of motion. Neck supple.  Cardiovascular: Normal rate, regular rhythm and normal heart sounds.  Exam reveals no gallop and no friction rub.   No murmur heard. Equal femoral pulses.  Pulmonary/Chest: Effort normal and breath sounds normal. No respiratory distress. He has no wheezes. He has no rales.  Abdominal: Soft. There is no tenderness.  Genitourinary:  Testicles nontender.  Musculoskeletal: Normal range of motion.  Left paraspinal lumbar pain. No midline pain.  5/5 strength in bilateral lower extremities. Ankle plantar and dorsiflexion intact. Great toe extension intact bilaterally. +2 DP and PT pulses. +2 patellar reflexes bilaterally. Normal gait.  Neurological: He is alert and oriented to person, place, and time.  Skin: Skin is warm and dry. No rash noted.  Psychiatric: He has a normal mood and affect. His behavior is normal.    ED Course  Procedures (including critical care time) DIAGNOSTIC  STUDIES: Oxygen Saturation is 97% on room air, adequate by my interpretation.    COORDINATION OF CARE: 8:13 AM- Will order Percocet and ibuprofen.  Patient informed of current plan for treatment and evaluation and agrees with plan at this time.   8:44 AM - Discussed UA results with patient. No suspicion for UTI or kidney stone. Patient is able to ambulate without difficult but indicates pain with walking. Will discharge patient with prescriptions for pain medicines and NSAIDs.   Results for orders placed during the hospital encounter of 02/10/14  URINALYSIS, ROUTINE W REFLEX MICROSCOPIC      Result Value Ref Range   Color, Urine YELLOW  YELLOW   APPearance CLEAR  CLEAR   Specific Gravity, Urine 1.017  1.005 - 1.030   pH 6.5  5.0 - 8.0   Glucose, UA NEGATIVE  NEGATIVE mg/dL   Hgb urine dipstick NEGATIVE  NEGATIVE   Bilirubin Urine NEGATIVE  NEGATIVE   Ketones, ur NEGATIVE  NEGATIVE mg/dL   Protein, ur NEGATIVE  NEGATIVE mg/dL   Urobilinogen, UA 0.2  0.0 - 1.0 mg/dL   Nitrite NEGATIVE  NEGATIVE   Leukocytes, UA NEGATIVE  NEGATIVE     MDM   Final diagnoses:  Back pain   Acute on chronic left-sided low back pain unrelieved by Motrin. No focal weakness, numbness or tingling. No bowel or bladder incontinence.  No evidence of cord compression or cauda equina. Able to ambulate without difficulty. UA negative.  Will treat with anti-inflammatories and pain medication. Refer to PCP. No evidence of cauda equina or cord compression. Return precautions discussed  I personally performed the services described in this documentation, which was scribed in my presence. The recorded information has been reviewed and is accurate.    Glynn OctaveStephen Vinny Taranto, MD 02/10/14 [phone]

## 2014-02-10 NOTE — ED Notes (Signed)
Low back pain that started yesterday unrelieved after taking Motrin.

## 2014-02-10 NOTE — Discharge Instructions (Signed)
Dolor De Espalda Crónico  (Chronic Back Pain)   Cuando el dolor en la espalda dura más de 3 meses, se denomina dolor de espalda crónico. Las personas que sufren dolor de espalda crónico generalmente pasan por períodos en los que es más intenso (brotes).   CAUSAS   El dolor de espalda crónico puede estar originado en el desgaste degeneración) de las diferentes estructuras de la espalda. Estas estructuras incluyen:  · Los huesos de la columna vertebral (vértebras) y las articulaciones rodean la médula espinal y las raíces nerviosas (facetas).  · Hay un tejido fibroso y fuerte que conecta las vértebras (ligamentos).  La degeneración de estas estructuras provoca presión sobre los nervios. Esto puede causar un dolor permanente.   INSTRUCCIONES PARA EL CUIDADO EN EL HOGAR   · Evite encorvarse, levantar mucho peso, permanecer sentado por mucho tiempo, y las actividades que puedan empeorar el problema.  · Tome breves períodos de descanso a través del día para reducir el dolor durante los brotes. Recostarse o permanecer de pie generalmente es mejor que permanecer sentado para descansar.  · Tome sólo medicamentos de venta libre o recetados, según las indicaciones del médico.  SOLICITE ATENCIÓN MÉDICA DE INMEDIATO SI:  · Siente debilidad intensa o adormecimiento en una de sus piernas o pies.  · Tiene dificultad para controlar la vejiga o el intestino.  · Presenta náuseas, vómitos, dolor abdominal, falta de aire o desmayos.  Document Released: 10/13/2005 Document Revised: 01/05/2012  Exit[employer]® Patient Information ©2014 [employer].

## 2014-07-28 ENCOUNTER — Emergency Department (HOSPITAL_BASED_OUTPATIENT_CLINIC_OR_DEPARTMENT_OTHER)
Admission: EM | Admit: 2014-07-28 | Discharge: 2014-07-28 | Disposition: A | Discharge status: Home / Self Care | Payer: Self-pay | Attending: Emergency Medicine | Admitting: Emergency Medicine

## 2014-07-28 ENCOUNTER — Emergency Department (HOSPITAL_BASED_OUTPATIENT_CLINIC_OR_DEPARTMENT_OTHER): Payer: Self-pay

## 2014-07-28 ENCOUNTER — Encounter (HOSPITAL_BASED_OUTPATIENT_CLINIC_OR_DEPARTMENT_OTHER): Payer: Self-pay | Admitting: Emergency Medicine

## 2014-07-28 DIAGNOSIS — J219 Acute bronchiolitis, unspecified: Secondary | ICD-10-CM | POA: Insufficient documentation

## 2014-07-28 DIAGNOSIS — J40 Bronchitis, not specified as acute or chronic: Secondary | ICD-10-CM

## 2014-07-28 DIAGNOSIS — Z8739 Personal history of other diseases of the musculoskeletal system and connective tissue: Secondary | ICD-10-CM | POA: Insufficient documentation

## 2014-07-28 DIAGNOSIS — Z791 Long term (current) use of non-steroidal anti-inflammatories (NSAID): Secondary | ICD-10-CM | POA: Insufficient documentation

## 2014-07-28 MED ORDER — ALBUTEROL SULFATE (2.5 MG/3ML) 0.083% IN NEBU
2.5000 mg | INHALATION_SOLUTION | Freq: Once | RESPIRATORY_TRACT | Status: AC
  Administered 2014-07-28: 2.5 mg via RESPIRATORY_TRACT
  Filled 2014-07-28: qty 3

## 2014-07-28 MED ORDER — ALBUTEROL SULFATE HFA 108 (90 BASE) MCG/ACT IN AERS
1.0000 | INHALATION_SPRAY | RESPIRATORY_TRACT | Status: DC | PRN
  Administered 2014-07-28: 2 via RESPIRATORY_TRACT
  Filled 2014-07-28: qty 6.7

## 2014-07-28 MED ORDER — AZITHROMYCIN 250 MG PO TABS
250.0000 mg | ORAL_TABLET | Freq: Every day | ORAL | Status: DC
Start: 2014-07-28 — End: 2016-06-16

## 2014-07-28 MED ORDER — IPRATROPIUM-ALBUTEROL 0.5-2.5 (3) MG/3ML IN SOLN
3.0000 mL | Freq: Once | RESPIRATORY_TRACT | Status: AC
  Administered 2014-07-28: 3 mL via RESPIRATORY_TRACT
  Filled 2014-07-28: qty 3

## 2014-07-28 NOTE — ED Notes (Signed)
Pt amb to room 5 with quick steady gait in nad. Pt reports cough and sob x 1 week, smiling and talking with sig other in nad. Rt at bedside for eval.

## 2014-07-28 NOTE — ED Provider Notes (Signed)
CSN: [id number]     Arrival date & time 07/28/14  0800 History   First MD Initiated Contact with Patient 07/28/14 [phone]     Chief Complaint  Patient presents with  . Shortness of Breath  . Cough     (Consider location/radiation/quality/duration/timing/severity/associated sxs/prior Treatment) Patient is a 38 y.o. male presenting with shortness of breath and cough. The history is provided by the patient and a friend. The history is limited by a language barrier. A language interpreter was used.  Shortness of Breath Severity:  Moderate Onset quality:  Gradual Duration:  5 days Timing:  Constant Progression:  Worsening Chronicity:  Recurrent Context: activity and URI   Relieved by:  None tried Worsened by:  Coughing and deep breathing Ineffective treatments:  None tried Associated symptoms: cough, sputum production and wheezing   Associated symptoms: no abdominal pain, no chest pain, no fever, no hemoptysis and no vomiting   Risk factors: tobacco use   Risk factors comment:  No recent travel Cough Associated symptoms: shortness of breath and wheezing   Associated symptoms: no chest pain and no fever     Past Medical History  Diagnosis Date  . Bronchitis   . DDD (degenerative disc disease)    History reviewed. No pertinent past surgical history. History reviewed. No pertinent family history. History  Substance Use Topics  . Smoking status: Current Some Day Smoker    Types: Cigarettes  . Smokeless tobacco: Not on file  . Alcohol Use: No    Review of Systems  Constitutional: Negative for fever.  Respiratory: Positive for cough, sputum production, shortness of breath and wheezing. Negative for hemoptysis.   Cardiovascular: Negative for chest pain.  Gastrointestinal: Negative for vomiting and abdominal pain.  All other systems reviewed and are negative.     Allergies  Review of patient's allergies indicates no known allergies.  Home Medications   Prior to Admission  medications   Medication Sig Start Date End Date Taking? Authorizing Provider  azithromycin (ZITHROMAX) 250 MG tablet Take 1 tablet (250 mg total) by mouth daily. Take first 2 tablets together, then 1 every day until finished. 07/28/14   Gwyneth Sprout, MD  guaiFENesin-dextromethorphan (ROBITUSSIN DM) 100-10 MG/5ML syrup Take 20 mLs by mouth every 4 (four) hours as needed for cough.    Historical Provider, MD  HYDROcodone-acetaminophen (NORCO/VICODIN) 5-325 MG per tablet Take 2 tablets by mouth every 4 (four) hours as needed. 02/10/14   Glynn Octave, MD  ibuprofen (ADVIL,MOTRIN) 200 MG tablet Take 600 mg by mouth every 6 (six) hours as needed for headache or moderate pain.    Historical Provider, MD  ibuprofen (ADVIL,MOTRIN) 800 MG tablet Take 1 tablet (800 mg total) by mouth 3 (three) times daily. 02/10/14   Glynn Octave, MD   BP 129/79  Pulse 69  Temp(Src) 98 F (36.7 C) (Oral)  Resp 18  Ht 6' (1.829 m)  SpO2 100% Physical Exam  Nursing note and vitals reviewed. Constitutional: He is oriented to person, place, and time. He appears well-developed and well-nourished. No distress.  HENT:  Head: Normocephalic and atraumatic.  Nose: Mucosal edema and rhinorrhea present.  Mouth/Throat: Posterior oropharyngeal erythema present. No oropharyngeal exudate or posterior oropharyngeal edema.  Eyes: Conjunctivae and EOM are normal. Pupils are equal, round, and reactive to light.  Neck: Normal range of motion. Neck supple.  Cardiovascular: Normal rate, regular rhythm and intact distal pulses.   No murmur heard. Pulmonary/Chest: Effort normal. No respiratory distress. He has wheezes in the right  upper field and the right middle field. He has no rales.  Abdominal: Soft. He exhibits no distension. There is no tenderness. There is no rebound and no guarding.  Musculoskeletal: Normal range of motion. He exhibits no edema and no tenderness.  Neurological: He is alert and oriented to person, place, and  time.  Skin: Skin is warm and dry. No rash noted. No erythema.  Psychiatric: He has a normal mood and affect. His behavior is normal.    ED Course  Procedures (including critical care time) Labs Review Labs Reviewed - No data to display  Imaging Review Dg Chest 2 View  07/28/2014   CLINICAL DATA:  Cough, wheezing, and shortness of breath for 1 week.  EXAM: CHEST  2 VIEW  COMPARISON:  09/12/2013  FINDINGS: The cardiomediastinal silhouette is within normal limits. The lungs are well inflated and clear. There is no evidence of pleural effusion or pneumothorax. No acute osseous abnormality is identified.  IMPRESSION: Unremarkable chest radiographs.   Electronically Signed   By: Sebastian AcheAllen  Grady   On: 07/28/2014 08:55     EKG Interpretation None      MDM   Final diagnoses:  Bronchitis    Pt with symptoms consistent with viral bronchitis.  Well appearing here.  No signs of breathing difficulty but mild wheezing on the right side.  No signs of pharyngitis, otitis or abnormal abdominal findings.   CXR wnl and pt to return with any further problems.  Pt given albuterol and azithromycin for treatment and strict return precautions     [name], MD 07/28/14 [phone]

## 2015-04-07 ENCOUNTER — Emergency Department (HOSPITAL_BASED_OUTPATIENT_CLINIC_OR_DEPARTMENT_OTHER)
Admission: EM | Admit: 2015-04-07 | Discharge: 2015-04-07 | Disposition: A | Discharge status: Home / Self Care | Payer: Self-pay | Attending: Emergency Medicine | Admitting: Emergency Medicine

## 2015-04-07 ENCOUNTER — Emergency Department (HOSPITAL_BASED_OUTPATIENT_CLINIC_OR_DEPARTMENT_OTHER): Payer: Self-pay

## 2015-04-07 ENCOUNTER — Encounter (HOSPITAL_BASED_OUTPATIENT_CLINIC_OR_DEPARTMENT_OTHER): Payer: Self-pay | Admitting: *Deleted

## 2015-04-07 DIAGNOSIS — Z79899 Other long term (current) drug therapy: Secondary | ICD-10-CM | POA: Insufficient documentation

## 2015-04-07 DIAGNOSIS — Z720 Tobacco use: Secondary | ICD-10-CM | POA: Insufficient documentation

## 2015-04-07 DIAGNOSIS — Z791 Long term (current) use of non-steroidal anti-inflammatories (NSAID): Secondary | ICD-10-CM | POA: Insufficient documentation

## 2015-04-07 DIAGNOSIS — B9789 Other viral agents as the cause of diseases classified elsewhere: Secondary | ICD-10-CM

## 2015-04-07 DIAGNOSIS — J988 Other specified respiratory disorders: Secondary | ICD-10-CM

## 2015-04-07 DIAGNOSIS — J069 Acute upper respiratory infection, unspecified: Secondary | ICD-10-CM | POA: Insufficient documentation

## 2015-04-07 HISTORY — DX: Dorsalgia, unspecified: M54.9

## 2015-04-07 LAB — RAPID STREP SCREEN (MED CTR MEBANE ONLY): STREPTOCOCCUS, GROUP A SCREEN (DIRECT): NEGATIVE

## 2015-04-07 NOTE — ED Notes (Signed)
Sorethroat, ,  Facial pressure, nausea  Dry cough x 1 week

## 2015-04-07 NOTE — Discharge Instructions (Signed)
Tos - Adultos  (Cough, Adult) Continue to take Mucinex as directed and Tylenol or Advil for pain. You must stop smoking as smoking lens itself to these types of infections. [facility] if you are not feeling better in 2 weeks and to get a primary care physician. Ask your new primary care doctor to help you to stop smoking. Return if you feel worse for any reason.  La tos es un reflejo. Ayuda a limpiar la garganta y las vas areas. Ayuda a que el organismo se cure. La tos puede durar entre 2  3 semanas (aguda) o puede durar ms de 8 semanas (crnica) Las causas ms frecuentes son las infecciones, las alergias o los resfros.  CUIDADOS EN EL HOGAR   Slo tome la medicacin segn las indicaciones.  Si le indican medicamentos (antibiticos), tmelos tal como se le indic. Tmelos todos, aunque se sienta mejor.  Coloque un vaporizador o humidificador de niebla fra en su casa. Esto puede ayudar a Runner, broadcasting/film/video (secreciones).  Duerma de modo que quede casi sentado (semi erguido). Use almohadas para lograr esta posicin. Esto ayuda a disminuir la tos.  Descanse todo lo que pueda.  Si fuma, abandone el hbito. SOLICITE AYUDA DE INMEDIATO SI:   Observa una secrecin de color blanco amarillento (pus) en el moco.  La tos empeora.  El medicamento no disminuye la tos y no puede dormir.  Tose y escupe sangre.  Tiene dificultad para respirar.  Siente un dolor ms intenso y los medicamentos no [employer].  Tiene fiebre. ASEGRESE DE QUE:   Comprende estas instrucciones.  Controlar su enfermedad.  Solicitar ayuda de inmediato si no mejora o si empeora. Document Released: 06/26/2011 Document Revised: 01/05/2012 [facility] Patient Information 2015 [place], [place]. This information is not intended to replace advice given to you by your health care provider. Make sure you discuss any questions you have with your health care provider.

## 2015-04-07 NOTE — ED Notes (Signed)
C/o sorethroat, dry cough, facial pressure x 1 week  otc meds not helping

## 2015-04-07 NOTE — ED Provider Notes (Addendum)
CSN: [id number]     Arrival date & time 04/07/15  0630 History   First MD Initiated Contact with Patient 04/07/15 [phone]     Chief Complaint  Patient presents with  . Sore Throat   Patient speaks no English. Patient's wife acting as interpreter. Professional interpreter offered the patient which he declined.  (Consider location/radiation/quality/duration/timing/severity/associated sxs/prior Treatment) HPI Complains of sore throat and pressure in his face dry cough and nausea for 1 week. No known fever. No other associated symptoms Treated with Mucinex and Advil. Last dose of Mucinex and Advil last night. Nothing makes symptoms better or worse. Past Medical History  Diagnosis Date  . Bronchitis   . Back pain    History reviewed. No pertinent past surgical history. No family history on file. History  Substance Use Topics  . Smoking status: Current Some Day Smoker    Types: Cigarettes  . Smokeless tobacco: Not on file  . Alcohol Use: No    Review of Systems  Constitutional: Negative.   HENT: Positive for congestion, sinus pressure and sore throat.   Respiratory: Positive for cough.   Cardiovascular: Negative.   Gastrointestinal: Negative.   Musculoskeletal: Negative.   Skin: Negative.   Neurological: Negative.   Psychiatric/Behavioral: Negative.   All other systems reviewed and are negative.     Allergies  Review of patient's allergies indicates no known allergies.  Home Medications   Prior to Admission medications   Medication Sig Start Date End Date Taking? Authorizing Provider  dextromethorphan-guaiFENesin (MUCINEX DM) 30-600 MG per 12 hr tablet Take 1 tablet by mouth 2 (two) times daily.   Yes Historical Provider, MD  azithromycin (ZITHROMAX) 250 MG tablet Take 1 tablet (250 mg total) by mouth daily. Take first 2 tablets together, then 1 every day until finished. 07/28/14   Gwyneth Sprout, MD  guaiFENesin-dextromethorphan (ROBITUSSIN DM) 100-10 MG/5ML syrup Take 20  mLs by mouth every 4 (four) hours as needed for cough.    Historical Provider, MD  HYDROcodone-acetaminophen (NORCO/VICODIN) 5-325 MG per tablet Take 2 tablets by mouth every 4 (four) hours as needed. 02/10/14   Glynn Octave, MD  ibuprofen (ADVIL,MOTRIN) 200 MG tablet Take 600 mg by mouth every 6 (six) hours as needed for headache or moderate pain.    Historical Provider, MD  ibuprofen (ADVIL,MOTRIN) 800 MG tablet Take 1 tablet (800 mg total) by mouth 3 (three) times daily. 02/10/14   Glynn Octave, MD   BP 125/79 mmHg  Pulse 60  Temp(Src) 97.6 F (36.4 C) (Oral)  Resp 20  Ht 6' (1.829 m)  Wt 180 lb (81.647 kg)  BMI 24.41 kg/m2  SpO2 98% Physical Exam  Constitutional: He appears well-developed and well-nourished.  HENT:  Head: Normocephalic and atraumatic.  Right Ear: External ear normal.  Left Ear: External ear normal.  Nose: Nose normal.  Mouth/Throat: Oropharynx is clear and moist.  Eyes: Conjunctivae are normal. Pupils are equal, round, and reactive to light.  Neck: Neck supple. No tracheal deviation present. No thyromegaly present.  Cardiovascular: Normal rate and regular rhythm.   No murmur heard. Pulmonary/Chest: Effort normal and breath sounds normal.  Abdominal: Soft. Bowel sounds are normal. He exhibits no distension. There is no tenderness.  Musculoskeletal: Normal range of motion. He exhibits no edema or tenderness.  Neurological: He is alert. Coordination normal.  Skin: Skin is warm and dry. No rash noted.  Psychiatric: He has a normal mood and affect.  Nursing note and vitals reviewed.  7:35 AM patient resting comfortably.  No distress ED Course  Procedures (including critical care time) Labs Review Labs Reviewed  RAPID STREP SCREEN (NOT AT [facility])    Imaging Review No results found.   EKG Interpretation None     Chest x-ray viewed by me Results for orders placed or performed during the hospital encounter of 04/07/15  Rapid strep screen  Result Value  Ref Range   Streptococcus, Group A Screen (Direct) NEGATIVE NEGATIVE   Dg Chest 2 View  04/07/2015   CLINICAL DATA:  Dry cough for 1 week, chest tightness  EXAM: CHEST - 2 VIEW  COMPARISON:  07/28/2014  FINDINGS: The heart size and mediastinal contours are within normal limits. Both lungs are clear. The visualized skeletal structures are unremarkable.  IMPRESSION: No active disease.   Electronically Signed   By: Alcide Clever M.D.   On: 04/07/2015 07:14    MDM  Plan continue Mucinex, Tylenol or ibuprofen for discomfort. Referral [facility] if not improved in 2 weeks. counciled pt for 3 minutes on smoking cessation Diagnosis viral respiratory illness Final diagnoses:  None        Doug Sou, MD 04/07/15 [id number]  Doug Sou, MD 04/07/15 [phone]

## 2015-04-10 LAB — CULTURE, GROUP A STREP: Strep A Culture: NEGATIVE

## 2016-03-24 ENCOUNTER — Encounter (HOSPITAL_BASED_OUTPATIENT_CLINIC_OR_DEPARTMENT_OTHER): Payer: Self-pay

## 2016-03-24 ENCOUNTER — Emergency Department (HOSPITAL_BASED_OUTPATIENT_CLINIC_OR_DEPARTMENT_OTHER)
Admission: EM | Admit: 2016-03-24 | Discharge: 2016-03-24 | Disposition: A | Discharge status: Home / Self Care | Payer: Self-pay | Attending: Emergency Medicine | Admitting: Emergency Medicine

## 2016-03-24 DIAGNOSIS — K029 Dental caries, unspecified: Secondary | ICD-10-CM | POA: Insufficient documentation

## 2016-03-24 DIAGNOSIS — Z87891 Personal history of nicotine dependence: Secondary | ICD-10-CM | POA: Insufficient documentation

## 2016-03-24 MED ORDER — AMOXICILLIN 500 MG PO CAPS
500.0000 mg | ORAL_CAPSULE | Freq: Once | ORAL | Status: AC
  Administered 2016-03-24: 500 mg via ORAL
  Filled 2016-03-24: qty 1

## 2016-03-24 MED ORDER — HYDROCODONE-ACETAMINOPHEN 5-325 MG PO TABS: 1.0000 | ORAL_TABLET | ORAL | Status: DC | PRN

## 2016-03-24 MED ORDER — AMOXICILLIN 500 MG PO CAPS: 500.0000 mg | ORAL_CAPSULE | Freq: Three times a day (TID) | ORAL | Status: DC

## 2016-03-24 MED ORDER — HYDROCODONE-ACETAMINOPHEN 5-325 MG PO TABS
1.0000 | ORAL_TABLET | Freq: Once | ORAL | Status: AC
  Administered 2016-03-24: 1 via ORAL
  Filled 2016-03-24: qty 1

## 2016-03-24 MED ORDER — IBUPROFEN 800 MG PO TABS: 800.0000 mg | ORAL_TABLET | Freq: Three times a day (TID) | ORAL | Status: DC

## 2016-03-24 NOTE — Discharge Instructions (Signed)
Caries dentales  (Dental Caries)  Caries dentales (enfermedad en los dientes) Esta enfermedad puede originar un hueco en los dientes (carie) que puede volverse más grande y profunda a lo largo del tiempo.  CUIDADOS EN EL HOGAR  · Cepille sus dientes y use hilo dental. Hágalo por lo menos dos veces al día.  · Use dentífrico con flúor.  · Use enjuague bucal si así se lo indica el dentista o el médico.  · Coma menos alimentos con azúcar y almidón. Beba menos bebidas azucaradas.  · Evite comer con frecuencia bocadillos con azúcar y almidón. Evite beber con frecuencia bebidas azucaradas.  · Concurra a los controles y limpiezas regulares con el dentista.  · Use suplementos con flúor, si así se lo indica el dentista o el médico.  · Permita que le coloquen flúor en los dientes si así se lo indica el dentista o el médico.     Esta información no tiene como fin reemplazar el consejo del médico. Asegúrese de hacerle al médico cualquier pregunta que tenga.     Document Released: 08/03/2013 Document Revised: 11/03/2014  Elsevier Interactive Patient Education ©2016 [employer]

## 2016-03-24 NOTE — ED Provider Notes (Signed)
CSN: [id number]     Arrival date & time 03/24/16  0808 History   First MD Initiated Contact with Patient 03/24/16 0820     Chief Complaint  Patient presents with  . Dental Pain   PT SAID THAT HE HAS HAD PAIN TO HIS RIGHT LOWER MOLAR FOR 2-3 DAYS.  PT DOES NOT HAVE DENTAL INSURANCE SO HAS BEEN UNABLE TO SEE A DENTIST.  (Consider location/radiation/quality/duration/timing/severity/associated sxs/prior Treatment) Patient is a 40 y.o. male presenting with tooth pain. The history is provided by the patient. The history is limited by a language barrier. A language interpreter was used.  Dental Pain Location:  Lower Quality:  Dull and aching Severity:  Moderate Onset quality:  Gradual Timing:  Constant Progression:  Worsening Chronicity:  New   Past Medical History  Diagnosis Date  . Bronchitis   . Back pain    History reviewed. No pertinent past surgical history. No family history on file. Social History  Substance Use Topics  . Smoking status: Former Smoker    Types: Cigarettes  . Smokeless tobacco: None  . Alcohol Use: No    Review of Systems  HENT: Positive for dental problem.   All other systems reviewed and are negative.     Allergies  Review of patient's allergies indicates no known allergies.  Home Medications   Prior to Admission medications   Medication Sig Start Date End Date Taking? Authorizing Provider  amoxicillin (AMOXIL) 500 MG capsule Take 1 capsule (500 mg total) by mouth 3 (three) times daily. 03/24/16   Jacalyn Lefevre, MD  azithromycin (ZITHROMAX) 250 MG tablet Take 1 tablet (250 mg total) by mouth daily. Take first 2 tablets together, then 1 every day until finished. 07/28/14   Gwyneth Sprout, MD  dextromethorphan-guaiFENesin (MUCINEX DM) 30-600 MG per 12 hr tablet Take 1 tablet by mouth 2 (two) times daily.    Historical Provider, MD  guaiFENesin-dextromethorphan (ROBITUSSIN DM) 100-10 MG/5ML syrup Take 20 mLs by mouth every 4 (four) hours as needed  for cough.    Historical Provider, MD  HYDROcodone-acetaminophen (NORCO/VICODIN) 5-325 MG tablet Take 1 tablet by mouth every 4 (four) hours as needed. 03/24/16   Jacalyn Lefevre, MD  ibuprofen (ADVIL,MOTRIN) 800 MG tablet Take 1 tablet (800 mg total) by mouth 3 (three) times daily. 03/24/16   Jacalyn Lefevre, MD   BP 134/80 mmHg  Pulse 58  Temp(Src) 97.8 F (36.6 C) (Oral)  Resp 18  Ht 6' (1.829 m)  Wt 188 lb (85.276 kg)  BMI 25.49 kg/m2  SpO2 98% Physical Exam  Constitutional: He is oriented to person, place, and time. He appears well-developed and well-nourished.  HENT:  Head: Normocephalic and atraumatic.  Right Ear: External ear normal.  Left Ear: External ear normal.  Nose: Nose normal.  MULTIPLE DENTAL CARIES.  Eyes: Conjunctivae and EOM are normal. Pupils are equal, round, and reactive to light.  Neck: Normal range of motion. Neck supple.  Cardiovascular: Normal rate, regular rhythm, normal heart sounds and intact distal pulses.   Pulmonary/Chest: Effort normal and breath sounds normal.  Abdominal: Soft. Bowel sounds are normal.  Musculoskeletal: Normal range of motion.  Neurological: He is alert and oriented to person, place, and time.  Skin: Skin is warm and dry.  Psychiatric: He has a normal mood and affect. His behavior is normal. Thought content normal.  Nursing note and vitals reviewed.   ED Course  Procedures (including critical care time) Labs Review Labs Reviewed - No data to display  Imaging  Review No results found. I have personally reviewed and evaluated these images and lab results as part of my medical decision-making.   EKG Interpretation None      MDM   Final diagnoses:  Dental caries        Jacalyn LefevreJulie Kamilla Hands, MD 03/24/16 [phone]

## 2016-03-24 NOTE — ED Notes (Signed)
Pt reports dental pain in right lower back molar x 2-3 days and now radiating to the rest of his face, no swelling or fevers.

## 2016-06-16 ENCOUNTER — Encounter (HOSPITAL_BASED_OUTPATIENT_CLINIC_OR_DEPARTMENT_OTHER): Payer: Self-pay | Admitting: *Deleted

## 2016-06-16 ENCOUNTER — Emergency Department (HOSPITAL_BASED_OUTPATIENT_CLINIC_OR_DEPARTMENT_OTHER)
Admission: EM | Admit: 2016-06-16 | Discharge: 2016-06-16 | Disposition: A | Discharge status: Home / Self Care | Payer: Self-pay | Attending: Emergency Medicine | Admitting: Emergency Medicine

## 2016-06-16 ENCOUNTER — Emergency Department (HOSPITAL_BASED_OUTPATIENT_CLINIC_OR_DEPARTMENT_OTHER): Payer: Self-pay

## 2016-06-16 DIAGNOSIS — J40 Bronchitis, not specified as acute or chronic: Secondary | ICD-10-CM | POA: Insufficient documentation

## 2016-06-16 DIAGNOSIS — Z87891 Personal history of nicotine dependence: Secondary | ICD-10-CM | POA: Insufficient documentation

## 2016-06-16 DIAGNOSIS — M5440 Lumbago with sciatica, unspecified side: Secondary | ICD-10-CM | POA: Insufficient documentation

## 2016-06-16 DIAGNOSIS — J0100 Acute maxillary sinusitis, unspecified: Secondary | ICD-10-CM | POA: Insufficient documentation

## 2016-06-16 MED ORDER — ALBUTEROL SULFATE HFA 108 (90 BASE) MCG/ACT IN AERS
2.0000 | INHALATION_SPRAY | Freq: Once | RESPIRATORY_TRACT | Status: AC
  Administered 2016-06-16: 2 via RESPIRATORY_TRACT
  Filled 2016-06-16: qty 6.7

## 2016-06-16 MED ORDER — AMOXICILLIN 500 MG PO CAPS: 500.0000 mg | ORAL_CAPSULE | Freq: Three times a day (TID) | ORAL | 0 refills | Status: DC

## 2016-06-16 MED FILL — AMOXICILLIN 500 MG CAPSULE: 500 | 7 days supply | Qty: 21 | Fill #0

## 2016-06-16 NOTE — Discharge Instructions (Signed)
Continue ibuprofen for pain. See back exercises attached. Do them daily.  Take inhaler 2 puffs every 4 hrs as needed for wheezing and shortness of breath. Continue muscinex/sudafed. Take amoxil as prescribed until all gone. Follow up with primary care doctor.

## 2016-06-16 NOTE — ED Provider Notes (Signed)
[facility] Provider Note   CSN: [id number] Arrival date & time: 06/16/16  4098     History   Chief Complaint Chief Complaint  Patient presents with  . Other    cough and congestion    HPI Kenyada Hy is a 40 y.o. male.  HPI Abdishakur Gottschall is a 40 y.o. male with history of chronic back pain and bronchitis, presents to emergency department complaining of facial pain, congestion, cough, persistent back pain. Patient states that his facial pain and sinus pressure started approximately 2 weeks ago. He reports it is getting worse. He states he feels like his face is swollen and tender to the touch. He reports associated headache. He reports popping sensation in his ears. He denies any sore throat. He reports nonproductive cough and intermittent wheezing. He quit smoking 6 months ago. He states he is having some pain in his chest only when coughing. Patient has been taking Mucinex and Motrin with no relief of his symptoms. He did not measure her temperature at home, but states he has had associated chills. Denies any neck pain or stiffness. No nausea or vomiting. No diarrhea. No chest pain or abdominal pain. Patient is also complaining of persistent lower back pain. He states he was told he has degenerative disc disease. He has been taking Motrin for the pain which is not helping. He denies any recent strenuous activity or injuries. He denies any pain rating down his legs at this time, but states sometimes pain does shoot down both extremities. Denies any numbness or weakness in his extremities. No trouble controlling his bladder or bowels. No urinary symptoms. Patient does not have PCP at this time.  Past Medical History:  Diagnosis Date  . Back pain   . Bronchitis     There are no active problems to display for this patient.   History reviewed. No pertinent surgical history.     Home Medications    Prior to Admission medications   Medication  Sig Start Date End Date Taking? Authorizing Provider  amoxicillin (AMOXIL) 500 MG capsule Take 1 capsule (500 mg total) by mouth 3 (three) times daily. 03/24/16   Jacalyn Lefevre, MD  azithromycin (ZITHROMAX) 250 MG tablet Take 1 tablet (250 mg total) by mouth daily. Take first 2 tablets together, then 1 every day until finished. 07/28/14   Gwyneth Sprout, MD  dextromethorphan-guaiFENesin (MUCINEX DM) 30-600 MG per 12 hr tablet Take 1 tablet by mouth 2 (two) times daily.    Historical Provider, MD  guaiFENesin-dextromethorphan (ROBITUSSIN DM) 100-10 MG/5ML syrup Take 20 mLs by mouth every 4 (four) hours as needed for cough.    Historical Provider, MD  HYDROcodone-acetaminophen (NORCO/VICODIN) 5-325 MG tablet Take 1 tablet by mouth every 4 (four) hours as needed. 03/24/16   Jacalyn Lefevre, MD  ibuprofen (ADVIL,MOTRIN) 800 MG tablet Take 1 tablet (800 mg total) by mouth 3 (three) times daily. 03/24/16   Jacalyn Lefevre, MD    Family History No family history on file.  Social History Social History  Substance Use Topics  . Smoking status: Former Smoker    Types: Cigarettes  . Smokeless tobacco: Never Used  . Alcohol use No     Allergies   Review of patient's allergies indicates no known allergies.   Review of Systems Review of Systems  Constitutional: Positive for chills. Negative for fever.  HENT: Positive for congestion, ear pain, facial swelling, rhinorrhea and sinus pressure. Negative for hearing loss, mouth sores, sore throat and trouble swallowing.  Respiratory: Negative for cough, chest tightness and shortness of breath.   Cardiovascular: Negative for chest pain, palpitations and leg swelling.  Gastrointestinal: Negative for abdominal distention, abdominal pain, diarrhea, nausea and vomiting.  Genitourinary: Negative for dysuria, frequency, hematuria and urgency.  Musculoskeletal: Positive for arthralgias and back pain. Negative for myalgias, neck pain and neck stiffness.  Skin:  Negative for rash.  Allergic/Immunologic: Negative for immunocompromised state.  Neurological: Positive for headaches. Negative for dizziness, weakness, light-headedness and numbness.  All other systems reviewed and are negative.    Physical Exam Updated Vital Signs BP 151/91 (BP Location: Right Arm)   Pulse 69   Temp 98.3 F (36.8 C) (Oral)   Resp 18   Ht 6' (1.829 m)   Wt 84.8 kg   SpO2 99%   BMI 25.36 kg/m   Physical Exam  Constitutional: He appears well-developed and well-nourished. No distress.  HENT:  Head: Normocephalic and atraumatic.  Right Ear: External ear normal.  Left Ear: External ear normal.  Mouth/Throat: Oropharyngeal exudate present.  Bilateral frontal and maxillary sinus tenderness. Nasal mucosa is edematous. Cerumen impaction in bilateral ear canals.  Eyes: Conjunctivae and EOM are normal. Pupils are equal, round, and reactive to light.  Neck: Neck supple.  Cardiovascular: Normal rate, regular rhythm and normal heart sounds.   Pulmonary/Chest: Effort normal. No respiratory distress. He has no wheezes. He has no rales.  Abdominal: Soft. Bowel sounds are normal. He exhibits no distension. There is no tenderness. There is no rebound.  Musculoskeletal: He exhibits no edema.  No midline lumbar spine tedneress.   Neurological: He is alert.  5/5 and equal lower extremity strength. 2+ and equal patellar reflexes bilaterally. Pt able to dorsiflex bilateral toes and feet with good strength against resistance. Equal sensation bilaterally over thighs and lower legs.   Skin: Skin is warm and dry.  Nursing note and vitals reviewed.    ED Treatments / Results  Labs (all labs ordered are listed, but only abnormal results are displayed) Labs Reviewed - No data to display  EKG  EKG Interpretation None       Radiology Dg Chest 2 View  Result Date: 06/16/2016 CLINICAL DATA:  Cough and chest congestion as well as frontal headache and facial pain with onset 2  weeks ago; also anterior chest discomfort and nonproductive cough. EXAM: CHEST  2 VIEW COMPARISON:  PA and lateral chest x-ray are April 07, 2015 FINDINGS: The lungs are well-expanded and clear. The heart and mediastinal structures are normal. There is no pleural effusion. The bony thorax is unremarkable. IMPRESSION: There is no active cardiopulmonary disease. Electronically Signed   By: David  SwazilandJordan M.D.   On: 06/16/2016 08:00    Procedures Procedures (including critical care time)  Medications Ordered in ED Medications - No data to display   Initial Impression / Assessment and Plan / ED Course  I have reviewed the triage vital signs and the nursing notes.  Pertinent labs & imaging results that were available during my care of the patient were reviewed by me and considered in my medical decision making (see chart for details).  Clinical Course    Patient in emergency department complaining of sinus pain and pressure, cough, subjective fever chills. Exam is unremarkable other than significant tenderness over bilateral maxillary and frontal sinuses. Nasal mucosa is edematous. Question sinusitis/bronchitis. No wheezing at this time, however patient states he does have wheezing intermittently. He is an ex-smoker. Given symptoms for 2 weeks, will place on amoxicillin. Will  given an inhaler for wheezing. Continue Mucinex/Sudafed. Continue ibuprofen for chronic back pain. At this time no evidence of cauda equina. Will need follow-up with primary care doctor.   Final Clinical Impressions(s) / ED Diagnoses   Final diagnoses:  Acute maxillary sinusitis, recurrence not specified  Bronchitis  Low back pain with sciatica, sciatica laterality unspecified, unspecified back pain laterality    New Prescriptions New Prescriptions   AMOXICILLIN (AMOXIL) 500 MG CAPSULE    Take 1 capsule (500 mg total) by mouth 3 (three) times daily.     Jaynie Crumbleatyana Ethelreda Sukhu, PA-C 06/16/16 0930    Doug SouSam Jacubowitz,  MD 06/16/16 1558

## 2016-06-16 NOTE — ED Triage Notes (Addendum)
Patient c/o frontal h/a and facial pain that started a couple weeks ago along with cough/congestion. Fevers unknown. C/o pain to anterior chest with cough. States a dry cough. Also c/o lower back pain. Denies any recent injury. Denies any urinary symptoms. Has had no relief with sudafed, motrin, and mucinex c/o feeling sob at times.

## 2016-09-30 ENCOUNTER — Emergency Department (HOSPITAL_COMMUNITY)
Admission: EM | Admit: 2016-09-30 | Discharge: 2016-09-30 | Disposition: A | Discharge status: Home / Self Care | Payer: Self-pay | Attending: Emergency Medicine | Admitting: Emergency Medicine

## 2016-09-30 ENCOUNTER — Emergency Department (HOSPITAL_COMMUNITY): Payer: Self-pay

## 2016-09-30 ENCOUNTER — Encounter (HOSPITAL_COMMUNITY): Payer: Self-pay | Admitting: Emergency Medicine

## 2016-09-30 DIAGNOSIS — Z79899 Other long term (current) drug therapy: Secondary | ICD-10-CM | POA: Insufficient documentation

## 2016-09-30 DIAGNOSIS — J069 Acute upper respiratory infection, unspecified: Secondary | ICD-10-CM | POA: Insufficient documentation

## 2016-09-30 DIAGNOSIS — Z87891 Personal history of nicotine dependence: Secondary | ICD-10-CM | POA: Insufficient documentation

## 2016-09-30 DIAGNOSIS — J0101 Acute recurrent maxillary sinusitis: Secondary | ICD-10-CM | POA: Insufficient documentation

## 2016-09-30 DIAGNOSIS — Z7722 Contact with and (suspected) exposure to environmental tobacco smoke (acute) (chronic): Secondary | ICD-10-CM

## 2016-09-30 DIAGNOSIS — J4521 Mild intermittent asthma with (acute) exacerbation: Secondary | ICD-10-CM | POA: Insufficient documentation

## 2016-09-30 HISTORY — DX: Chronic sinusitis, unspecified: J32.9

## 2016-09-30 LAB — RAPID STREP SCREEN (MED CTR MEBANE ONLY): Streptococcus, Group A Screen (Direct): NEGATIVE

## 2016-09-30 MED ORDER — KETOROLAC TROMETHAMINE 30 MG/ML IJ SOLN
30.0000 mg | Freq: Once | INTRAMUSCULAR | Status: AC
  Administered 2016-09-30: 30 mg via INTRAMUSCULAR
  Filled 2016-09-30: qty 1

## 2016-09-30 MED ORDER — AMOXICILLIN 500 MG PO CAPS: 500.0000 mg | ORAL_CAPSULE | Freq: Three times a day (TID) | ORAL | 0 refills | Status: DC

## 2016-09-30 MED ORDER — IPRATROPIUM-ALBUTEROL 0.5-2.5 (3) MG/3ML IN SOLN
3.0000 mL | Freq: Once | RESPIRATORY_TRACT | Status: AC
  Administered 2016-09-30: 3 mL via RESPIRATORY_TRACT
  Filled 2016-09-30: qty 3

## 2016-09-30 MED ORDER — HYDROCODONE-ACETAMINOPHEN 5-325 MG PO TABS: 1.0000 | ORAL_TABLET | ORAL | 0 refills | Status: DC | PRN

## 2016-09-30 MED ORDER — IBUPROFEN 800 MG PO TABS: 800.0000 mg | ORAL_TABLET | Freq: Three times a day (TID) | ORAL | 0 refills | Status: DC

## 2016-09-30 MED ORDER — AMOXICILLIN 500 MG PO CAPS
500.0000 mg | ORAL_CAPSULE | Freq: Once | ORAL | Status: AC
  Administered 2016-09-30: 500 mg via ORAL
  Filled 2016-09-30: qty 1

## 2016-09-30 MED ORDER — AEROCHAMBER PLUS FLO-VU MEDIUM MISC
1.0000 | Freq: Once | Status: AC
  Administered 2016-09-30: 1
  Filled 2016-09-30: qty 1

## 2016-09-30 MED ORDER — ALBUTEROL SULFATE HFA 108 (90 BASE) MCG/ACT IN AERS
1.0000 | INHALATION_SPRAY | RESPIRATORY_TRACT | Status: DC | PRN
  Filled 2016-09-30: qty 6.7

## 2016-09-30 NOTE — ED Triage Notes (Addendum)
Patient reports productive cough x2 weeks. Patient reports bloody nasal discharge starting this morning. Hx of sinusitis.

## 2016-09-30 NOTE — ED Provider Notes (Signed)
[facility] Provider Note   CSN: [id number] Arrival date & time: 09/30/16  [id number]     History   Chief Complaint Chief Complaint  Patient presents with  . Cough    HPI Parker Lambert is a 40 y.o. male.  Pt has been sick for the past 2 weeks.  He is primarily a Insurance claims handlerspanish speaker.  Information was obtained through his family member.   He has had a productive cough and nasal discharge.  He coughed yesterday and there was some blood in it.  He blew his nose this am and it was bloody.  He has not had a fever, but has felt hot.  The pt has had sinusitis in the past that has felt like this.      Past Medical History:  Diagnosis Date  . Back pain   . Bronchitis   . Sinusitis     There are no active problems to display for this patient.   History reviewed. No pertinent surgical history.     Home Medications    Prior to Admission medications   Medication Sig Start Date End Date Taking? Authorizing Provider  amoxicillin (AMOXIL) 500 MG capsule Take 1 capsule (500 mg total) by mouth 3 (three) times daily. 09/30/16   Jacalyn LefevreJulie Tyronda Vizcarrondo, MD  dextromethorphan-guaiFENesin [facility](MUCINEX DM) 30-600 MG per 12 hr tablet Take 1 tablet by mouth 2 (two) times daily.    Historical Provider, MD  guaiFENesin-dextromethorphan (ROBITUSSIN DM) 100-10 MG/5ML syrup Take 20 mLs by mouth every 4 (four) hours as needed for cough.    Historical Provider, MD  HYDROcodone-acetaminophen (NORCO/VICODIN) 5-325 MG tablet Take 1 tablet by mouth every 4 (four) hours as needed. 09/30/16   Jacalyn LefevreJulie Jiaire Rosebrook, MD  ibuprofen (ADVIL,MOTRIN) 800 MG tablet Take 1 tablet (800 mg total) by mouth 3 (three) times daily. 09/30/16   Jacalyn LefevreJulie Casey Maxfield, MD    Family History No family history on file.  Social History Social History  Substance Use Topics  . Smoking status: Former Smoker    Types: Cigarettes  . Smokeless tobacco: Never Used  . Alcohol use No     Allergies   Patient has no known allergies.   Review  of Systems Review of Systems  Constitutional: Positive for chills and fatigue.  HENT: Positive for sinus pain, sinus pressure and sore throat.   Respiratory: Positive for cough, shortness of breath and wheezing.   All other systems reviewed and are negative.    Physical Exam Updated Vital Signs BP 159/90 (BP Location: Left Arm)   Pulse 72   Temp 98.2 F (36.8 C) (Oral)   Resp 18   Ht 6' (1.829 m)   Wt 187 lb (84.8 kg)   SpO2 99%   BMI 25.36 kg/m   Physical Exam  Constitutional: He is oriented to person, place, and time. He appears well-developed and well-nourished.  HENT:  Head: Normocephalic and atraumatic.  Right Ear: External ear normal.  Left Ear: External ear normal.  Nose: Mucosal edema present.  Mouth/Throat: Mucous membranes are dry. Posterior oropharyngeal erythema present.  Eyes: EOM are normal. Pupils are equal, round, and reactive to light.  Neck: Normal range of motion. Neck supple.  Cardiovascular: Normal rate, regular rhythm, normal heart sounds and intact distal pulses.   Pulmonary/Chest: Effort normal. He has wheezes.  Abdominal: Soft. Bowel sounds are normal.  Musculoskeletal: Normal range of motion.  Lymphadenopathy:    He has cervical adenopathy.  Neurological: He is alert and oriented to person, place, and time.  Skin: Skin is warm. Capillary refill takes less than 2 seconds.  Psychiatric: He has a normal mood and affect. His behavior is normal. Judgment and thought content normal.  Nursing note and vitals reviewed.    ED Treatments / Results  Labs (all labs ordered are listed, but only abnormal results are displayed) Labs Reviewed  RAPID STREP SCREEN (NOT AT [facility])  CULTURE, GROUP A STREP [facility])    EKG  EKG Interpretation None       Radiology Dg Chest 2 View  Result Date: 09/30/2016 CLINICAL DATA:  Cough, congestion and mid chest pain for 2 weeks EXAM: CHEST  2 VIEW COMPARISON:  June 16, 2016 FINDINGS: The heart size and  mediastinal contours are within normal limits. There is no focal infiltrate, pulmonary edema, or pleural effusion. The visualized skeletal structures are unremarkable. IMPRESSION: No active cardiopulmonary disease. Electronically Signed   By: Sherian ReinWei-Chen  Lin M.D.   On: 09/30/2016 08:23    Procedures Procedures (including critical care time)  Medications Ordered in ED Medications  albuterol (PROVENTIL HFA;VENTOLIN HFA) 108 (90 Base) MCG/ACT inhaler 1-2 puff (not administered)  AEROCHAMBER PLUS FLO-VU MEDIUM MISC 1 each (not administered)  amoxicillin (AMOXIL) capsule 500 mg (not administered)  ketorolac (TORADOL) 30 MG/ML injection 30 mg (30 mg Intramuscular Given 09/30/16 0810)  ipratropium-albuterol (DUONEB) 0.5-2.5 (3) MG/3ML nebulizer solution 3 mL (3 mLs Nebulization Given 09/30/16 0827)     Initial Impression / Assessment and Plan / ED Course  I have reviewed the triage vital signs and the nursing notes.  Pertinent labs & imaging results that were available during my care of the patient were reviewed by me and considered in my medical decision making (see chart for details).  Clinical Course     Pt feels much better after the neb and toradol.  He will be given an albuterol inhaler prior to d/c.  He will also be treated with amox and 6, 5 mg, lortab and ibuprofen.  He is given the number to [facility].  His family is encouraged not to smoke.  He knows to return if worse.  Final Clinical Impressions(s) / ED Diagnoses   Final diagnoses:  Acute recurrent maxillary sinusitis  Viral upper respiratory tract infection  Mild intermittent reactive airway disease with acute exacerbation  Secondhand smoke exposure    New Prescriptions Current Discharge Medication List       Jacalyn LefevreJulie Cashe Gatt, MD 09/30/16 [phone]

## 2016-10-02 LAB — CULTURE, GROUP A STREP (THRC)

## 2017-06-07 ENCOUNTER — Encounter (HOSPITAL_COMMUNITY): Payer: Self-pay | Admitting: Nurse Practitioner

## 2017-06-07 ENCOUNTER — Emergency Department (HOSPITAL_COMMUNITY)
Admission: EM | Admit: 2017-06-07 | Discharge: 2017-06-07 | Disposition: A | Discharge status: Home / Self Care | Payer: Self-pay | Attending: Emergency Medicine | Admitting: Emergency Medicine

## 2017-06-07 DIAGNOSIS — Z87891 Personal history of nicotine dependence: Secondary | ICD-10-CM | POA: Insufficient documentation

## 2017-06-07 DIAGNOSIS — K029 Dental caries, unspecified: Secondary | ICD-10-CM | POA: Insufficient documentation

## 2017-06-07 MED ORDER — PENICILLIN V POTASSIUM 500 MG PO TABS: 500.0000 mg | ORAL_TABLET | Freq: Four times a day (QID) | ORAL | 0 refills | Status: AC

## 2017-06-07 MED ORDER — IBUPROFEN 600 MG PO TABS: 600.0000 mg | ORAL_TABLET | Freq: Four times a day (QID) | ORAL | 0 refills | Status: DC | PRN

## 2017-06-07 NOTE — ED Provider Notes (Signed)
[facility] DEPT Provider Note   CSN: [id number] Arrival date & time: 06/07/17  2012     History   Chief Complaint Chief Complaint  Patient presents with  . Dental Pain    HPI Parker Lambert is a 41 y.o. male.  41 year old male complains of left lower dental pain times several days. Used Excedrin without relief. No fever or chills. No trouble swallowing. Pain is persistent and is cold sensitive. No recent dental work.      Past Medical History:  Diagnosis Date  . Back pain   . Bronchitis   . Sinusitis     There are no active problems to display for this patient.   History reviewed. No pertinent surgical history.     Home Medications    Prior to Admission medications   Medication Sig Start Date End Date Taking? Authorizing Provider  amoxicillin (AMOXIL) 500 MG capsule Take 1 capsule (500 mg total) by mouth 3 (three) times daily. 09/30/16   Jacalyn Lefevre, MD  dextromethorphan-guaiFENesin [facility] DM) 30-600 MG per 12 hr tablet Take 1 tablet by mouth 2 (two) times daily.    [provider]  guaiFENesin-dextromethorphan (ROBITUSSIN DM) 100-10 MG/5ML syrup Take 20 mLs by mouth every 4 (four) hours as needed for cough.    [provider]  HYDROcodone-acetaminophen (NORCO/VICODIN) 5-325 MG tablet Take 1 tablet by mouth every 4 (four) hours as needed. 09/30/16   Jacalyn Lefevre, MD  ibuprofen (ADVIL,MOTRIN) 800 MG tablet Take 1 tablet (800 mg total) by mouth 3 (three) times daily. 09/30/16   Jacalyn Lefevre, MD    Family History History reviewed. No pertinent family history.  Social History Social History  Substance Use Topics  . Smoking status: Former Smoker    Types: Cigarettes  . Smokeless tobacco: Never Used  . Alcohol use No     Allergies   Patient has no known allergies.   Review of Systems Review of Systems  All other systems reviewed and are negative.    Physical Exam Updated Vital Signs BP (!) 169/105 (BP  Location: Left Arm)   Pulse 71   Temp 97.6 F (36.4 C) (Oral)   SpO2 100%   Physical Exam  Constitutional: He is oriented to person, place, and time. He appears well-developed and well-nourished.  Non-toxic appearance. No distress.  HENT:  Head: Normocephalic and atraumatic.  Mouth/Throat: Dental caries present. No dental abscesses.  Eyes: Pupils are equal, round, and reactive to light. Conjunctivae, EOM and lids are normal.  Neck: Normal range of motion. Neck supple. No tracheal deviation present. No thyroid mass present.  Cardiovascular: Normal rate, regular rhythm and normal heart sounds.  Exam reveals no gallop.   No murmur heard. Pulmonary/Chest: Effort normal and breath sounds normal. No stridor. No respiratory distress. He has no decreased breath sounds. He has no wheezes. He has no rhonchi. He has no rales.  Abdominal: Soft. Normal appearance and bowel sounds are normal. He exhibits no distension. There is no tenderness. There is no rebound and no CVA tenderness.  Musculoskeletal: Normal range of motion. He exhibits no edema or tenderness.  Neurological: He is alert and oriented to person, place, and time. He has normal strength. No cranial nerve deficit or sensory deficit. GCS eye subscore is 4. GCS verbal subscore is 5. GCS motor subscore is 6.  Skin: Skin is warm and dry. No abrasion and no rash noted.  Psychiatric: He has a normal mood and affect. His speech is normal and behavior is normal.  Nursing note and vitals reviewed.    ED Treatments / Results  Labs (all labs ordered are listed, but only abnormal results are displayed) Labs Reviewed - No data to display  EKG  EKG Interpretation None       Radiology No results found.  Procedures Procedures (including critical care time)  Medications Ordered in ED Medications - No data to display   Initial Impression / Assessment and Plan / ED Course  I have reviewed the triage vital signs and the nursing  notes.  Pertinent labs & imaging results that were available during my care of the patient were reviewed by me and considered in my medical decision making (see chart for details).     Patient be treated for dental caries  Final Clinical Impressions(s) / ED Diagnoses   Final diagnoses:  None    New Prescriptions New Prescriptions   No medications on file     Lorre NickAllen, Bryne Lindon, MD 06/07/17 2236

## 2017-06-07 NOTE — ED Triage Notes (Signed)
Pt is c/o ongoing dental pain, lower molar left side, adding that it is causing his whole face to hurt.

## 2017-06-09 ENCOUNTER — Encounter (HOSPITAL_BASED_OUTPATIENT_CLINIC_OR_DEPARTMENT_OTHER): Payer: Self-pay | Admitting: *Deleted

## 2017-06-09 ENCOUNTER — Emergency Department (HOSPITAL_BASED_OUTPATIENT_CLINIC_OR_DEPARTMENT_OTHER)
Admission: EM | Admit: 2017-06-09 | Discharge: 2017-06-09 | Disposition: A | Discharge status: Home / Self Care | Payer: Self-pay | Attending: Emergency Medicine | Admitting: Emergency Medicine

## 2017-06-09 DIAGNOSIS — K0889 Other specified disorders of teeth and supporting structures: Secondary | ICD-10-CM | POA: Insufficient documentation

## 2017-06-09 DIAGNOSIS — Z87891 Personal history of nicotine dependence: Secondary | ICD-10-CM | POA: Insufficient documentation

## 2017-06-09 MED ORDER — LIDOCAINE VISCOUS 2 % MT SOLN: 15.0000 mL | OROMUCOSAL | 2 refills | Status: DC | PRN

## 2017-06-09 MED ORDER — BUPIVACAINE-EPINEPHRINE (PF) 0.5% -1:200000 IJ SOLN
1.8000 mL | Freq: Once | INTRAMUSCULAR | Status: AC
  Administered 2017-06-09: 1.8 mL
  Filled 2017-06-09: qty 1.8

## 2017-06-09 MED ORDER — BUPIVACAINE-EPINEPHRINE (PF) 0.5% -1:200000 IJ SOLN
INTRAMUSCULAR | Status: AC
  Administered 2017-06-09: 1.8 mL
  Filled 2017-06-09: qty 1.8

## 2017-06-09 MED ORDER — OXYCODONE-ACETAMINOPHEN 5-325 MG PO TABS
2.0000 | ORAL_TABLET | Freq: Once | ORAL | Status: AC
  Administered 2017-06-09: 2 via ORAL
  Filled 2017-06-09: qty 2

## 2017-06-09 MED FILL — LIDOCAINE 2% VISCOUS SOLN: 2 | 30 days supply | Qty: 100 | Fill #0

## 2017-06-09 NOTE — ED Notes (Signed)
Pt educated about not driving or performing critical tasks (such as operating heavy machinery or caring for an infant/toddler/child) d/t taking narcotics. Educated about not mixing narcotics with alcohol and/or other sedatives (such as muscle relaxers, Benadryl). Pt verbalized understanding.

## 2017-06-09 NOTE — ED Provider Notes (Signed)
[facility] Provider [facility]   CSN: [id number] Arrival date & time: 06/09/17  1258     History   Chief Complaint Chief Complaint  Patient presents with  . Dental Pain    HPI Parker Lambert is a 41 y.o. male.  HPI   Parker Lambert is a 41 y.o. male presenting to the ED with Left lower dental pain for about the past week. Pain is 10/10, aching, nonradiating. He has not yet seen a dentist as he states he will be unable to do so until after he gets paid on August 24. Patient was seen at [facility] ED on August 12 and prescribed a course of penicillin. He states he has been compliant with this course. He has been alternating ibuprofen and Tylenol for pain management. Denies fever/chills, nausea/vomiting, difficulty breathing or swallowing, or any other complaints.   Past Medical History:  Diagnosis Date  . Back pain   . Bronchitis   . Sinusitis     There are no active problems to display for this patient.   History reviewed. No pertinent surgical history.     Home Medications    Prior to Admission medications   Medication Sig Start Date End Date Taking? Authorizing Provider  dextromethorphan-guaiFENesin (MUCINEX DM) 30-600 MG per 12 hr tablet Take 1 tablet by mouth 2 (two) times daily.    [provider]  guaiFENesin-dextromethorphan (ROBITUSSIN DM) 100-10 MG/5ML syrup Take 20 mLs by mouth every 4 (four) hours as needed for cough.    [provider]  ibuprofen (ADVIL,MOTRIN) 600 MG tablet Take 1 tablet (600 mg total) by mouth every 6 (six) hours as needed. 06/07/17   Lorre NickAllen, Anthony, MD  lidocaine (XYLOCAINE) 2 % solution Use as directed 15 mLs in the mouth or throat as needed for mouth pain. 06/09/17   Bayli Quesinberry C, PA-C  penicillin v potassium (VEETID) 500 MG tablet Take 1 tablet (500 mg total) by mouth 4 (four) times daily. 06/07/17 06/14/17  Lorre NickAllen, Anthony, MD    Family History No family history on file.  Social  History Social History  Substance Use Topics  . Smoking status: Former Smoker    Types: Cigarettes  . Smokeless tobacco: Never Used  . Alcohol use No     Allergies   Patient has no known allergies.   Review of Systems Review of Systems  Constitutional: Negative for chills and fever.  HENT: Positive for dental problem. Negative for drooling, facial swelling, trouble swallowing and voice change.   Respiratory: Negative for shortness of breath.      Physical Exam Updated Vital Signs BP (!) 131/97   Pulse 60   Temp 98.3 F (36.8 C) (Oral)   Resp 14   Ht 5\' 7"  (1.702 m)   Wt 84.8 kg (187 lb)   SpO2 99%   BMI 29.29 kg/m   Physical Exam  Constitutional: He appears well-developed and well-nourished. No distress.  HENT:  Head: Normocephalic and atraumatic.  Dental caries and tenderness to the left lower rear most molars. No surrounding area of swelling or fluctuance noted. No facial swelling noted. Patient has mouth opening to at least 3 finger widths. Able to handle oral secretions without difficulty. No trismus.  Eyes: Conjunctivae are normal.  Neck: Normal range of motion. Neck supple.  Cardiovascular: Normal rate and regular rhythm.   Pulmonary/Chest: Effort normal.  Lymphadenopathy:    He has no cervical adenopathy.  Neurological: He is alert.  Skin: Skin is warm and dry. He  is not diaphoretic.  Psychiatric: He has a normal mood and affect. His behavior is normal.  Nursing note and vitals reviewed.    ED Treatments / Results  Labs (all labs ordered are listed, but only abnormal results are displayed) Labs Reviewed - No data to display  EKG  EKG Interpretation None       Radiology No results found.  Procedures Dental Block Date/Time: 06/09/2017 3:50 PM Performed by: Anselm Pancoast Authorized by: Anselm Pancoast   Consent:    Consent obtained:  Verbal   Consent given by:  Patient   Risks discussed:  Allergic reaction, hematoma, infection, nerve damage,  pain, unsuccessful block and swelling Indications:    Indications: dental pain   Location:    Block type:  Inferior alveolar   Laterality:  Left Procedure details (see MAR for exact dosages):    Topical anesthetic:  Benzocaine gel   Syringe type:  Controlled syringe   Needle gauge:  27 G   Anesthetic injected:  Bupivacaine 0.5% WITH epi   Injection procedure:  Anatomic landmarks identified, anatomic landmarks palpated, introduced needle, negative aspiration for blood and incremental injection Post-procedure details:    Outcome:  Pain relieved   Patient tolerance of procedure:  Tolerated well, no immediate complications    (including critical care time)  Medications Ordered in ED Medications  bupivacaine-epinephrine (MARCAINE W/ EPI) 0.5% -1:200000 injection 1.8 mL (1.8 mLs Infiltration Given by Other 06/09/17 1525)  oxyCODONE-acetaminophen (PERCOCET/ROXICET) 5-325 MG per tablet 2 tablet (2 tablets Oral Given 06/09/17 1523)     Initial Impression / Assessment and Plan / ED Course  I have reviewed the triage vital signs and the nursing notes.  Pertinent labs & imaging results that were available during my care of the patient were reviewed by me and considered in my medical decision making (see chart for details).     Patient presents with dental pain. Doubt sepsis or Ludwig's angioedema. Recommend dentist follow-up. Resources given. Patient advised to at least make an appointment, even if it is scheduled for after the date he gets paid. The patient was given instructions for home care as well as return precautions. Patient voices understanding of these instructions, accepts the plan, and is comfortable with discharge.   Vitals:   06/09/17 1305 06/09/17 1307 06/09/17 1510  BP:  (!) 131/97 131/74  Pulse:  60 (!) 58  Resp:  14 18  Temp:  98.3 F (36.8 C)   TempSrc:  Oral   SpO2:  99% 100%  Weight: 84.8 kg (187 lb)    Height: 5\' 7"  (1.702 m)       Final Clinical  Impressions(s) / ED Diagnoses   Final diagnoses:  Pain, dental    New Prescriptions Discharge Medication List as of 06/09/2017  4:02 PM    START taking these medications   Details  lidocaine (XYLOCAINE) 2 % solution Use as directed 15 mLs in the mouth or throat as needed for mouth pain., Starting Tue 06/09/2017, Print         Desirai Traxler C, PA-C 06/09/17 1711    Doug Sou, MD 06/11/17 [phone]

## 2017-06-09 NOTE — Discharge Instructions (Signed)
You have been seen today for dental pain. You should follow up with a dentist as soon as possible. This problem will not resolve on its own without the care of a dentist. Use ibuprofen or naproxen for pain. Use the viscous lidocaine for mouth pain. Swish with the lidocaine and spit it out. Do not swallow it. ° °Please take all of your antibiotics until finished!   You may develop abdominal discomfort or diarrhea from the antibiotic.  You may help offset this with probiotics which you can buy or get in yogurt. Do not eat or take the probiotics until 2 hours after your antibiotic.  ° °Dental Resource Guide ° °[facility] °[address], [address] °[place], [place] [address] °[phone] ° °[facility] °[address] °[place], [place] [address] °[phone] ° °[facility] °[address] °[place], [place] [address] °[phone]. [phone] ° °[facility] °[address] °[place], [place] [address] °[phone] ° °[facility] °[address] °[place], [place] [address] °[phone] ° °[employer] °Www.[facility]istry.[employer].edu/patientcare/studentclinics/becomepatient ° °[employer] °[address] °[place], [place] [address] °[phone] ° °Website for free, low-income, or sliding scale dental services in [place]: °www.freedental.us ° °To find a dentist in [place] and surrounding areas: °www.ncdental.org/for-the-public/find-a-dentist ° °[facility] °http://www.ncdental.org/meetings-events/[place] ° ° Medicaid Dentist °https://dma.ncdhhs.gov/find-a-doctor/medicaid-dental-providers ° °

## 2017-06-09 NOTE — ED Triage Notes (Signed)
Toothache. He needs stronger medication.

## 2017-11-25 ENCOUNTER — Emergency Department (HOSPITAL_BASED_OUTPATIENT_CLINIC_OR_DEPARTMENT_OTHER): Payer: Self-pay

## 2017-11-25 ENCOUNTER — Encounter (HOSPITAL_BASED_OUTPATIENT_CLINIC_OR_DEPARTMENT_OTHER): Payer: Self-pay | Admitting: Emergency Medicine

## 2017-11-25 ENCOUNTER — Other Ambulatory Visit: Payer: Self-pay

## 2017-11-25 ENCOUNTER — Emergency Department (HOSPITAL_BASED_OUTPATIENT_CLINIC_OR_DEPARTMENT_OTHER)
Admission: EM | Admit: 2017-11-25 | Discharge: 2017-11-25 | Disposition: A | Discharge status: Home / Self Care | Payer: Self-pay | Attending: Emergency Medicine | Admitting: Emergency Medicine

## 2017-11-25 DIAGNOSIS — R0981 Nasal congestion: Secondary | ICD-10-CM | POA: Insufficient documentation

## 2017-11-25 DIAGNOSIS — R51 Headache: Secondary | ICD-10-CM | POA: Insufficient documentation

## 2017-11-25 DIAGNOSIS — J111 Influenza due to unidentified influenza virus with other respiratory manifestations: Secondary | ICD-10-CM | POA: Insufficient documentation

## 2017-11-25 DIAGNOSIS — R69 Illness, unspecified: Secondary | ICD-10-CM

## 2017-11-25 DIAGNOSIS — R0602 Shortness of breath: Secondary | ICD-10-CM | POA: Insufficient documentation

## 2017-11-25 DIAGNOSIS — R070 Pain in throat: Secondary | ICD-10-CM | POA: Insufficient documentation

## 2017-11-25 DIAGNOSIS — M791 Myalgia, unspecified site: Secondary | ICD-10-CM | POA: Insufficient documentation

## 2017-11-25 DIAGNOSIS — Z79899 Other long term (current) drug therapy: Secondary | ICD-10-CM | POA: Insufficient documentation

## 2017-11-25 DIAGNOSIS — Z87891 Personal history of nicotine dependence: Secondary | ICD-10-CM | POA: Insufficient documentation

## 2017-11-25 DIAGNOSIS — R1011 Right upper quadrant pain: Secondary | ICD-10-CM | POA: Insufficient documentation

## 2017-11-25 DIAGNOSIS — R6883 Chills (without fever): Secondary | ICD-10-CM | POA: Insufficient documentation

## 2017-11-25 LAB — CBC WITH DIFFERENTIAL/PLATELET
Basophils Absolute: 0 10*3/uL (ref 0.0–0.1)
Basophils Relative: 0 %
EOS PCT: 2 %
Eosinophils Absolute: 0.2 10*3/uL (ref 0.0–0.7)
HEMATOCRIT: 40.6 % (ref 39.0–52.0)
Hemoglobin: 13.6 g/dL (ref 13.0–17.0)
Lymphocytes Relative: 32 %
Lymphs Abs: 2.8 10*3/uL (ref 0.7–4.0)
MCH: 28 pg (ref 26.0–34.0)
MCHC: 33.5 g/dL (ref 30.0–36.0)
MCV: 83.5 fL (ref 78.0–100.0)
MONO ABS: 0.7 10*3/uL (ref 0.1–1.0)
Monocytes Relative: 8 %
Neutro Abs: 4.9 10*3/uL (ref 1.7–7.7)
Neutrophils Relative %: 58 %
PLATELETS: 179 10*3/uL (ref 150–400)
RBC: 4.86 MIL/uL (ref 4.22–5.81)
RDW: 14 % (ref 11.5–15.5)
WBC: 8.6 10*3/uL (ref 4.0–10.5)

## 2017-11-25 LAB — COMPREHENSIVE METABOLIC PANEL
ALBUMIN: 4.5 g/dL (ref 3.5–5.0)
ALK PHOS: 69 U/L (ref 38–126)
ALT: 21 U/L (ref 17–63)
ANION GAP: 9 (ref 5–15)
AST: 21 U/L (ref 15–41)
BUN: 11 mg/dL (ref 6–20)
CALCIUM: 9.2 mg/dL (ref 8.9–10.3)
CHLORIDE: 104 mmol/L (ref 101–111)
CO2: 27 mmol/L (ref 22–32)
CREATININE: 0.71 mg/dL (ref 0.61–1.24)
GFR calc Af Amer: >60 mL/min (ref >60)
GFR calc non Af Amer: >60 mL/min (ref >60)
GLUCOSE: 103 mg/dL — AB (ref 65–99)
Potassium: 3.9 mmol/L (ref 3.5–5.1)
SODIUM: 140 mmol/L (ref 135–145)
Total Bilirubin: 0.5 mg/dL (ref 0.3–1.2)
Total Protein: 7.6 g/dL (ref 6.5–8.1)

## 2017-11-25 LAB — LIPASE, BLOOD: LIPASE: 24 U/L (ref 11–51)

## 2017-11-25 MED ORDER — IBUPROFEN 400 MG PO TABS
600.0000 mg | ORAL_TABLET | Freq: Once | ORAL | Status: AC
  Administered 2017-11-25: 600 mg via ORAL
  Filled 2017-11-25: qty 1

## 2017-11-25 NOTE — ED Triage Notes (Signed)
Sinus congestion, sore throat, body aches, cough x4 days.  Others at home sick.  Denies fever.

## 2017-11-25 NOTE — ED Provider Notes (Signed)
[facility] EMERGENCY DEPARTMENT Provider Note   CSN: [id number] Arrival date & time: 11/25/17  [id number]     History   Chief Complaint Chief Complaint  Patient presents with  . flu-like symptoms    HPI Parker Lambert is a 42 y.o. male.  HPI  42 year old male presents with acute infectious symptoms.  He states he has been having cough with yellow sputum, diffuse body aches, chills without fever, sore throat, and headache for the last 4 days.  Some occasional shortness of breath.  He has had some on and off nasal congestion with some yellow drainage at times.  He denies nausea or vomiting but states he has had some right upper abdominal pain starting a day or 2 before this.  It is not worse with eating.  Some on and off diarrhea.  He denies any known chronic medical problems.  He has had multiple sick contacts.  He did not receive a flu vaccine this year.  Past Medical History:  Diagnosis Date  . Back pain   . Bronchitis   . Sinusitis     There are no active problems to display for this patient.   History reviewed. No pertinent surgical history.     Home Medications    Prior to Admission medications   Medication Sig Start Date End Date Taking? Authorizing Provider  dextromethorphan-guaiFENesin (MUCINEX DM) 30-600 MG per 12 hr tablet Take 1 tablet by mouth 2 (two) times daily.    [provider]  guaiFENesin-dextromethorphan (ROBITUSSIN DM) 100-10 MG/5ML syrup Take 20 mLs by mouth every 4 (four) hours as needed for cough.    [provider]  ibuprofen (ADVIL,MOTRIN) 600 MG tablet Take 1 tablet (600 mg total) by mouth every 6 (six) hours as needed. 06/07/17   Lorre NickAllen, Anthony, MD  lidocaine (XYLOCAINE) 2 % solution Use as directed 15 mLs in the mouth or throat as needed for mouth pain. 06/09/17   Anselm PancoastJoy, Shawn C, PA-C    Family History No family history on file.  Social History Social History   Tobacco Use  . Smoking status: Former Smoker      Types: Cigarettes  . Smokeless tobacco: Never Used  Substance Use Topics  . Alcohol use: No  . Drug use: No     Allergies   Patient has no known allergies.   Review of Systems Review of Systems  Constitutional: Positive for chills. Negative for fever.  HENT: Positive for congestion, sinus pressure, sinus pain and sore throat.   Respiratory: Positive for cough and shortness of breath.   Gastrointestinal: Positive for abdominal pain and diarrhea. Negative for vomiting.  Musculoskeletal: Positive for myalgias.  Neurological: Positive for headaches.  All other systems reviewed and are negative.    Physical Exam Updated Vital Signs BP 137/86 (BP Location: Right Arm)   Pulse 65   Temp (!) 97.5 F (36.4 C) (Oral)   Resp 16   Ht 6' (1.829 m)   Wt 83.9 kg (185 lb)   SpO2 99%   BMI 25.09 kg/m   Physical Exam  Constitutional: He is oriented to person, place, and time. He appears well-developed and well-nourished. No distress.  HENT:  Head: Normocephalic and atraumatic.  Right Ear: External ear normal.  Left Ear: External ear normal.  Nose: No rhinorrhea. Right sinus exhibits maxillary sinus tenderness (mild). Left sinus exhibits maxillary sinus tenderness (mild).  Mouth/Throat: Oropharynx is clear and moist. No oropharyngeal exudate.  Eyes: Right eye exhibits no discharge. Left eye exhibits  no discharge.  Neck: Neck supple.  Cardiovascular: Normal rate, regular rhythm and normal heart sounds.  Pulmonary/Chest: Effort normal and breath sounds normal. He has no wheezes. He has no rales.  Abdominal: Soft. There is tenderness (mild, sometimes no tenderness when distracted) in the right upper quadrant.  Musculoskeletal: He exhibits no edema.  Neurological: He is alert and oriented to person, place, and time.  Skin: Skin is warm and dry. He is not diaphoretic.  Nursing note and vitals reviewed.    ED Treatments / Results  Labs (all labs ordered are listed, but only  abnormal results are displayed) Labs Reviewed  COMPREHENSIVE METABOLIC PANEL - Abnormal; Notable for the following components:      Result Value   Glucose, Bld 103 (*)    All other components within normal limits  CBC WITH DIFFERENTIAL/PLATELET  LIPASE, BLOOD  PATHOLOGIST SMEAR REVIEW    EKG  EKG Interpretation  Date/Time:  Wednesday November 25 2017 10:40:53 EST Ventricular Rate:  67 PR Interval:    QRS Duration: 98 QT Interval:  389 QTC Calculation: 411 R Axis:   71 Text Interpretation:  Sinus rhythm Borderline short PR interval Probable left ventricular hypertrophy ST elev, probable normal early repol pattern No old tracing to compare Confirmed by Pricilla Loveless [phone]) on 11/25/2017 11:01:44 AM       Radiology Dg Chest 2 View  Result Date: 11/25/2017 CLINICAL DATA:  Cough. EXAM: CHEST  2 VIEW COMPARISON:  Radiographs of September 30, 2016. FINDINGS: The heart size and mediastinal contours are within normal limits. Both lungs are clear. No pneumothorax or pleural effusion is noted. The visualized skeletal structures are unremarkable. IMPRESSION: No active cardiopulmonary disease. Electronically Signed   By: Lupita Raider, M.D.   On: 11/25/2017 10:24    Procedures Procedures (including critical care time)  Medications Ordered in ED Medications  ibuprofen (ADVIL,MOTRIN) tablet 600 mg (600 mg Oral Given 11/25/17 1020)     Initial Impression / Assessment and Plan / ED Course  I have reviewed the triage vital signs and the nursing notes.  Pertinent labs & imaging results that were available during my care of the patient were reviewed by me and considered in my medical decision making (see chart for details).     Patient has some mild right upper quadrant abdominal pain but it is not always reproducible.  Given benign LFTs and other blood work, I do not think imaging is needed as my suspicion for gallbladder pathology is low.  Overall his symptoms are consistent with a  viral/influenza-like illness.  I do not see any signs of a bacterial infection.  Chest x-ray clear for pneumonia.  He has some yellow sputum and yellow nasal drainage but my suspicion is if he does have sinusitis is viral.  If this is influenza, I do not think testing would be beneficial given he is not significantly ill and is over 48 hours since onset so Tamiflu would not be helpful.  We discussed return precautions including fevers, shortness of breath, or other new/concerning symptoms.  Discussed over-the-counter treatments/supportive care.  Final Clinical Impressions(s) / ED Diagnoses   Final diagnoses:  Influenza-like illness  Abdominal pain, RUQ (right upper quadrant)    ED Discharge Orders    None       Pricilla Loveless, MD 11/25/17 1133

## 2017-11-26 LAB — PATHOLOGIST SMEAR REVIEW: PATH REVIEW: REACTIVE

## 2018-08-02 ENCOUNTER — Encounter (HOSPITAL_COMMUNITY): Payer: Self-pay | Admitting: Emergency Medicine

## 2018-08-02 ENCOUNTER — Emergency Department (HOSPITAL_COMMUNITY)
Admission: EM | Admit: 2018-08-02 | Discharge: 2018-08-02 | Disposition: A | Discharge status: Home / Self Care | Payer: Self-pay | Attending: Emergency Medicine | Admitting: Emergency Medicine

## 2018-08-02 DIAGNOSIS — J069 Acute upper respiratory infection, unspecified: Secondary | ICD-10-CM | POA: Insufficient documentation

## 2018-08-02 DIAGNOSIS — Y939 Activity, unspecified: Secondary | ICD-10-CM | POA: Insufficient documentation

## 2018-08-02 DIAGNOSIS — Y929 Unspecified place or not applicable: Secondary | ICD-10-CM | POA: Insufficient documentation

## 2018-08-02 DIAGNOSIS — X58XXXA Exposure to other specified factors, initial encounter: Secondary | ICD-10-CM | POA: Insufficient documentation

## 2018-08-02 DIAGNOSIS — Z87891 Personal history of nicotine dependence: Secondary | ICD-10-CM | POA: Insufficient documentation

## 2018-08-02 DIAGNOSIS — M5416 Radiculopathy, lumbar region: Secondary | ICD-10-CM | POA: Insufficient documentation

## 2018-08-02 DIAGNOSIS — M5410 Radiculopathy, site unspecified: Secondary | ICD-10-CM

## 2018-08-02 DIAGNOSIS — Y999 Unspecified external cause status: Secondary | ICD-10-CM | POA: Insufficient documentation

## 2018-08-02 DIAGNOSIS — S39012A Strain of muscle, fascia and tendon of lower back, initial encounter: Secondary | ICD-10-CM | POA: Insufficient documentation

## 2018-08-02 MED ORDER — CETIRIZINE HCL 10 MG PO TABS: 10.0000 mg | ORAL_TABLET | Freq: Every day | ORAL | 0 refills | Status: DC

## 2018-08-02 MED ORDER — IBUPROFEN 600 MG PO TABS: 600.0000 mg | ORAL_TABLET | Freq: Four times a day (QID) | ORAL | 0 refills | Status: DC | PRN

## 2018-08-02 MED ORDER — ACETAMINOPHEN ER 650 MG PO TBCR: 650.0000 mg | EXTENDED_RELEASE_TABLET | Freq: Three times a day (TID) | ORAL | 0 refills | Status: DC | PRN

## 2018-08-02 MED ORDER — OMEPRAZOLE 20 MG PO CPDR: 20.0000 mg | DELAYED_RELEASE_CAPSULE | Freq: Every day | ORAL | 0 refills | Status: DC

## 2018-08-02 MED ORDER — PREDNISONE 10 MG PO TABS: 50.0000 mg | ORAL_TABLET | Freq: Every day | ORAL | 0 refills | Status: DC

## 2018-08-02 NOTE — ED Provider Notes (Signed)
[facility]-EMERGENCY DEPT Provider Note   CSN: [id number] Arrival date & time: 08/02/18  4098     History   Chief Complaint Chief Complaint  Patient presents with  . Back Pain  . Facial Pain  . Nasal Congestion    HPI Parker Lambert is a 42 y.o. male.  HPI  42 year old male comes in with chief complaint of back pain, nasal congestion.  Patient reports that he has known history of degenerative disc disease of the lumbar spine.  2 weeks ago he started having back pain that is radiating down his left lower extremity.  There is no associated numbness, tingling.  Patient also complains of runny nose for the past few days, without any cough.  Past Medical History:  Diagnosis Date  . Back pain   . Bronchitis   . Sinusitis     There are no active problems to display for this patient.   History reviewed. No pertinent surgical history.      Home Medications    Prior to Admission medications   Medication Sig Start Date End Date Taking? Authorizing Provider  acetaminophen (TYLENOL 8 HOUR) 650 MG CR tablet Take 1 tablet (650 mg total) by mouth every 8 (eight) hours as needed. 08/02/18   Derwood Kaplan, MD  cetirizine (ZYRTEC ALLERGY) 10 MG tablet Take 1 tablet (10 mg total) by mouth daily. 08/02/18   Derwood Kaplan, MD  dextromethorphan-guaiFENesin (MUCINEX DM) 30-600 MG per 12 hr tablet Take 1 tablet by mouth 2 (two) times daily.    [provider]  guaiFENesin-dextromethorphan (ROBITUSSIN DM) 100-10 MG/5ML syrup Take 20 mLs by mouth every 4 (four) hours as needed for cough.    [provider]  ibuprofen (ADVIL,MOTRIN) 600 MG tablet Take 1 tablet (600 mg total) by mouth every 6 (six) hours as needed. 08/02/18   Derwood Kaplan, MD  lidocaine (XYLOCAINE) 2 % solution Use as directed 15 mLs in the mouth or throat as needed for mouth pain. 06/09/17   Joy, Shawn C, PA-C  omeprazole (PRILOSEC) 20 MG capsule Take 1 capsule (20 mg total)  by mouth daily. 08/02/18   Derwood Kaplan, MD  predniSONE (DELTASONE) 10 MG tablet Take 5 tablets (50 mg total) by mouth daily. 08/02/18   Derwood Kaplan, MD    Family History No family history on file.  Social History Social History   Tobacco Use  . Smoking status: Former Smoker    Types: Cigarettes  . Smokeless tobacco: Never Used  Substance Use Topics  . Alcohol use: No  . Drug use: No     Allergies   Patient has no known allergies.   Review of Systems Review of Systems  Constitutional: Positive for activity change.  HENT: Positive for rhinorrhea.   Musculoskeletal: Positive for back pain.  Neurological: Negative for numbness.     Physical Exam Updated Vital Signs BP (!) 145/88 (BP Location: Right Arm)   Pulse 69   Temp 97.9 F (36.6 C) (Oral)   Resp 20   SpO2 96%   Physical Exam  Constitutional: He is oriented to person, place, and time. He appears well-developed.  HENT:  Head: Atraumatic.  Neck: Neck supple.  Cardiovascular: Normal rate.  Pulmonary/Chest: Effort normal. He has no wheezes.  Musculoskeletal:  Pt has tenderness over the lumbar region No step offs, no erythema. Pt has 2+ patellar reflex bilaterally. Able to discriminate between sharp and dull. Able to ambulate   Neurological: He is alert and oriented to person, place,  and time.  Skin: Skin is warm.  Nursing note and vitals reviewed.    ED Treatments / Results  Labs (all labs ordered are listed, but only abnormal results are displayed) Labs Reviewed - No data to display  EKG None  Radiology No results found.  Procedures Procedures (including critical care time)  Medications Ordered in ED Medications - No data to display   Initial Impression / Assessment and Plan / ED Course  I have reviewed the triage vital signs and the nursing notes.  Pertinent labs & imaging results that were available during my care of the patient were reviewed by me and considered in my medical  decision making (see chart for details).    Patient comes in with chief complaint of back pain and rhinorrhea.  It seems like the rhinorrhea could be a viral infection or related to allergies.  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   Final Clinical Impressions(s) / ED Diagnoses   Final diagnoses:  Viral URI  Radicular pain  Lumbar strain, initial encounter    ED Discharge Orders         Ordered    ibuprofen (ADVIL,MOTRIN) 600 MG tablet  Every 6 hours PRN     08/02/18 0835    omeprazole (PRILOSEC) 20 MG capsule  Daily     08/02/18 0835    predniSONE (DELTASONE) 10 MG tablet  Daily     08/02/18 0835    acetaminophen (TYLENOL 8 HOUR) 650 MG CR tablet  Every 8 hours PRN     08/02/18 0835    cetirizine (ZYRTEC ALLERGY) 10 MG tablet  Daily     08/02/18 0836           Derwood Kaplan, MD 08/02/18 [phone]

## 2018-08-02 NOTE — Discharge Instructions (Signed)
Take the medications prescribed for proper pain control and to reduce inflammation. Perform the back exercises.

## 2018-08-02 NOTE — ED Triage Notes (Signed)
Pt c/o back pain that radiates down left leg for over 2 weeks not relieved when taking motrin or pain patches. Hx herniated disc.  Also for over week has sinus pain and swelling with congestion and some ear pain. Been taking Mucinex.

## 2018-11-22 ENCOUNTER — Emergency Department (HOSPITAL_BASED_OUTPATIENT_CLINIC_OR_DEPARTMENT_OTHER)
Admission: EM | Admit: 2018-11-22 | Discharge: 2018-11-22 | Disposition: A | Discharge status: Home / Self Care | Payer: Self-pay | Attending: Emergency Medicine | Admitting: Emergency Medicine

## 2018-11-22 ENCOUNTER — Other Ambulatory Visit: Payer: Self-pay

## 2018-11-22 ENCOUNTER — Encounter (HOSPITAL_BASED_OUTPATIENT_CLINIC_OR_DEPARTMENT_OTHER): Payer: Self-pay | Admitting: Emergency Medicine

## 2018-11-22 DIAGNOSIS — Z79899 Other long term (current) drug therapy: Secondary | ICD-10-CM | POA: Insufficient documentation

## 2018-11-22 DIAGNOSIS — Z87891 Personal history of nicotine dependence: Secondary | ICD-10-CM | POA: Insufficient documentation

## 2018-11-22 DIAGNOSIS — M5442 Lumbago with sciatica, left side: Secondary | ICD-10-CM | POA: Insufficient documentation

## 2018-11-22 MED ORDER — PREDNISONE 20 MG PO TABS: ORAL_TABLET | ORAL | 0 refills | Status: DC

## 2018-11-22 MED ORDER — LIDOCAINE 5 % EX PTCH: 1.0000 | MEDICATED_PATCH | CUTANEOUS | 0 refills | Status: DC

## 2018-11-22 MED FILL — predniSONE 20 MG TABS: 20 | 5 days supply | Qty: 11 | Fill #0

## 2018-11-22 NOTE — ED Provider Notes (Signed)
[facility] EMERGENCY DEPARTMENT Provider Note   CSN: [id number] Arrival date & time: 11/22/18  0703     History   Chief Complaint Chief Complaint  Patient presents with  . Back Pain    HPI Parker Lambert is a 43 y.o. male.  Patient is a 43 year old male who presents with left lower back pain.  He has had a history of recurrent episodes of pain in his left lower back that at times radiates down his left leg.  He intermittently has some numbness in the left leg but denies any currently.  He denies any weakness in the leg.  No loss of bowel or bladder control.  He denies any recent injuries but does work as a Administrator and recently has been riding on a machine that bounces him more which he feels like has flared up his back.  He has been using over-the-counter medicines without improvement in symptoms.  Also since he is here he reports that he has occasional hard stools.  He says when he has a hard stool he notices some blood when he wipes.  He denies any blood in the toilet or blood mixed with the stool.  He states he only has the blood when he wipes after he has a hard stool.  He denies any associated domino pain.  No dizziness or increased fatigue.  No current symptoms.  This is been going on intermittently for the last several months.     Past Medical History:  Diagnosis Date  . Back pain   . Bronchitis   . Sinusitis     There are no active problems to display for this patient.   History reviewed. No pertinent surgical history.      Home Medications    Prior to Admission medications   Medication Sig Start Date End Date Taking? Authorizing Provider  acetaminophen (TYLENOL 8 HOUR) 650 MG CR tablet Take 1 tablet (650 mg total) by mouth every 8 (eight) hours as needed. 08/02/18   Derwood Kaplan, MD  cetirizine (ZYRTEC ALLERGY) 10 MG tablet Take 1 tablet (10 mg total) by mouth daily. 08/02/18   Derwood Kaplan, MD  dextromethorphan-guaiFENesin (MUCINEX  DM) 30-600 MG per 12 hr tablet Take 1 tablet by mouth 2 (two) times daily.    [provider]  guaiFENesin-dextromethorphan (ROBITUSSIN DM) 100-10 MG/5ML syrup Take 20 mLs by mouth every 4 (four) hours as needed for cough.    [provider]  ibuprofen (ADVIL,MOTRIN) 600 MG tablet Take 1 tablet (600 mg total) by mouth every 6 (six) hours as needed. 08/02/18   Derwood Kaplan, MD  lidocaine (LIDODERM) 5 % Place 1 patch onto the skin daily. Remove & Discard patch within 12 hours or as directed by MD 11/22/18   Rolan Bucco, MD  lidocaine (XYLOCAINE) 2 % solution Use as directed 15 mLs in the mouth or throat as needed for mouth pain. 06/09/17   Joy, Shawn C, PA-C  omeprazole (PRILOSEC) 20 MG capsule Take 1 capsule (20 mg total) by mouth daily. 08/02/18   Derwood Kaplan, MD  predniSONE (DELTASONE) 20 MG tablet 3 tabs po day one, then 2 po daily x 4 days 11/22/18   Rolan Bucco, MD    Family History No family history on file.  Social History Social History   Tobacco Use  . Smoking status: Former Smoker    Types: Cigarettes  . Smokeless tobacco: Never Used  Substance Use Topics  . Alcohol use: No  . Drug use: No  Allergies   Patient has no known allergies.   Review of Systems Review of Systems  Constitutional: Negative for chills, diaphoresis, fatigue and fever.  HENT: Negative for congestion, rhinorrhea and sneezing.   Eyes: Negative.   Respiratory: Negative for cough, chest tightness and shortness of breath.   Cardiovascular: Negative for chest pain and leg swelling.  Gastrointestinal: Positive for blood in stool. Negative for abdominal pain, diarrhea, nausea and vomiting.  Genitourinary: Negative for difficulty urinating, flank pain, frequency and hematuria.  Musculoskeletal: Positive for back pain. Negative for arthralgias.  Skin: Negative for rash.  Neurological: Negative for dizziness, speech difficulty, weakness, numbness and headaches.     Physical  Exam Updated Vital Signs BP 137/88 (BP Location: Right Arm)   Pulse 73   Temp 98.1 F (36.7 C) (Oral)   Resp 16   Ht 6' (1.829 m)   Wt 81.6 kg   SpO2 98%   BMI 24.41 kg/m   Physical Exam Constitutional:      Appearance: He is well-developed.  HENT:     Head: Normocephalic and atraumatic.  Eyes:     Pupils: Pupils are equal, round, and reactive to light.  Neck:     Musculoskeletal: Normal range of motion and neck supple.  Cardiovascular:     Rate and Rhythm: Normal rate and regular rhythm.     Heart sounds: Normal heart sounds.  Pulmonary:     Effort: Pulmonary effort is normal. No respiratory distress.     Breath sounds: Normal breath sounds. No wheezing or rales.  Chest:     Chest wall: No tenderness.  Abdominal:     General: Bowel sounds are normal.     Palpations: Abdomen is soft.     Tenderness: There is no abdominal tenderness. There is no guarding or rebound.  Musculoskeletal: Normal range of motion.     Comments: Mild tenderness to his lower lumbar region.  Negative straight leg raise bilaterally.  Patellar reflexes symmetric bilaterally.  He has normal sensation and motor function to lower extremities.  Pedal pulses are intact.  Lymphadenopathy:     Cervical: No cervical adenopathy.  Skin:    General: Skin is warm and dry.     Findings: No rash.  Neurological:     Mental Status: He is alert and oriented to person, place, and time.      ED Treatments / Results  Labs (all labs ordered are listed, but only abnormal results are displayed) Labs Reviewed - No data to display  EKG None  Radiology No results found.  Procedures Procedures (including critical care time)  Medications Ordered in ED Medications - No data to display   Initial Impression / Assessment and Plan / ED Course  I have reviewed the triage vital signs and the nursing notes.  Pertinent labs & imaging results that were available during my care of the patient were reviewed by me and  considered in my medical decision making (see chart for details).     Patient is a 43 year old male who presents with an exacerbation of his low back pain.  Has had similar symptoms in the past.  He does not have any neurologic deficits.  No symptoms that would be more concerning for cauda equina.  He will be started on prednisone pack as well as lidocaine patches.  He will continue using ibuprofen and Tylenol at home.  I gave him a referral to follow-up with orthopedics.  Regarding his blood in the stool.  It sounds like  he has symptoms related to constipation.  He denies any pain around his rectum currently or any current symptoms.  I advised him to take a daily fiber supplement and to use hemorrhoid cream if he notices any tender hemorrhoid.  I advised him that if the symptoms continue he will need to have this checked out by a primary care physician or gastroenterologist.  Final Clinical Impressions(s) / ED Diagnoses   Final diagnoses:  Acute left-sided low back pain with left-sided sciatica    ED Discharge Orders         Ordered    predniSONE (DELTASONE) 20 MG tablet     11/22/18 0745    lidocaine (LIDODERM) 5 %  Every 24 hours     11/22/18 0745           Rolan Bucco, MD 11/22/18 [phone]

## 2018-11-22 NOTE — ED Triage Notes (Signed)
Pain in left low back and left leg x2 weeks.  Also blood in stool on occasion for a number of months.

## 2021-06-04 ENCOUNTER — Encounter (HOSPITAL_BASED_OUTPATIENT_CLINIC_OR_DEPARTMENT_OTHER): Payer: Self-pay

## 2021-06-04 ENCOUNTER — Other Ambulatory Visit: Payer: Self-pay

## 2021-06-04 ENCOUNTER — Emergency Department (HOSPITAL_BASED_OUTPATIENT_CLINIC_OR_DEPARTMENT_OTHER): Payer: Self-pay

## 2021-06-04 ENCOUNTER — Emergency Department (HOSPITAL_BASED_OUTPATIENT_CLINIC_OR_DEPARTMENT_OTHER)
Admission: EM | Admit: 2021-06-04 | Discharge: 2021-06-04 | Disposition: A | Discharge status: Home / Self Care | Payer: Self-pay | Attending: Emergency Medicine | Admitting: Emergency Medicine

## 2021-06-04 DIAGNOSIS — R079 Chest pain, unspecified: Secondary | ICD-10-CM | POA: Insufficient documentation

## 2021-06-04 DIAGNOSIS — M546 Pain in thoracic spine: Secondary | ICD-10-CM | POA: Insufficient documentation

## 2021-06-04 DIAGNOSIS — R0602 Shortness of breath: Secondary | ICD-10-CM | POA: Insufficient documentation

## 2021-06-04 DIAGNOSIS — Z87891 Personal history of nicotine dependence: Secondary | ICD-10-CM | POA: Insufficient documentation

## 2021-06-04 DIAGNOSIS — I10 Essential (primary) hypertension: Secondary | ICD-10-CM | POA: Insufficient documentation

## 2021-06-04 LAB — COMPREHENSIVE METABOLIC PANEL
ALT: 19 U/L (ref 0–44)
AST: 23 U/L (ref 15–41)
Albumin: 4.6 g/dL (ref 3.5–5.0)
Alkaline Phosphatase: 68 U/L (ref 38–126)
Anion gap: 10 (ref 5–15)
BUN: 18 mg/dL (ref 6–20)
CO2: 26 mmol/L (ref 22–32)
Calcium: 9.5 mg/dL (ref 8.9–10.3)
Chloride: 105 mmol/L (ref 98–111)
Creatinine, Ser: 0.72 mg/dL (ref 0.61–1.24)
GFR, Estimated: >60 mL/min (ref >60)
Glucose, Bld: 131 mg/dL — ABNORMAL HIGH (ref 70–99)
Potassium: 3.6 mmol/L (ref 3.5–5.1)
Sodium: 141 mmol/L (ref 135–145)
Total Bilirubin: 0.5 mg/dL (ref 0.3–1.2)
Total Protein: 7.6 g/dL (ref 6.5–8.1)

## 2021-06-04 LAB — CBC
HCT: 39.7 % (ref 39.0–52.0)
Hemoglobin: 13.3 g/dL (ref 13.0–17.0)
MCH: 27.9 pg (ref 26.0–34.0)
MCHC: 33.5 g/dL (ref 30.0–36.0)
MCV: 83.4 fL (ref 80.0–100.0)
Platelets: 207 10*3/uL (ref 150–400)
RBC: 4.76 MIL/uL (ref 4.22–5.81)
RDW: 13.8 % (ref 11.5–15.5)
WBC: 7 10*3/uL (ref 4.0–10.5)
nRBC: 0 % (ref 0.0–0.2)

## 2021-06-04 LAB — CK: Total CK: 188 U/L (ref 49–397)

## 2021-06-04 LAB — MAGNESIUM: Magnesium: 2 mg/dL (ref 1.7–2.4)

## 2021-06-04 LAB — TROPONIN I (HIGH SENSITIVITY): Troponin I (High Sensitivity): 5 ng/L (ref <18)

## 2021-06-04 LAB — LIPASE, BLOOD: Lipase: 24 U/L (ref 11–51)

## 2021-06-04 MED ORDER — KETOROLAC TROMETHAMINE 30 MG/ML IJ SOLN
30.0000 mg | Freq: Once | INTRAMUSCULAR | Status: AC
  Administered 2021-06-04: 30 mg via INTRAVENOUS

## 2021-06-04 MED ORDER — KETOROLAC TROMETHAMINE 60 MG/2ML IM SOLN
30.0000 mg | Freq: Once | INTRAMUSCULAR | Status: DC
  Filled 2021-06-04: qty 2

## 2021-06-04 NOTE — Discharge Instructions (Signed)
Call the number below to establish primary care doctor.  When you schedule a appointment with this doctor, discuss possibly starting medications to manage blood pressure.  There is a work note attached.  Avoid, if possible, strenuous activity that worsen your areas of pain.  Continue to treat pain with ibuprofen and Tylenol.  Please return to the emergency department if you experience any worsening symptoms.

## 2021-06-04 NOTE — ED Provider Notes (Signed)
[facility] EMERGENCY DEPARTMENT Provider Note   CSN: [id number] Arrival date & time: 06/04/21  2952     History Chief Complaint  Patient presents with   Shortness of Breath    Gurdeep Keesey is a 45 y.o. male.  The history is provided by the patient and the spouse. The history is limited by a language barrier. A language interpreter was used.  Shortness of Breath Associated symptoms: chest pain   Associated symptoms: no abdominal pain, no cough, no ear pain, no fever, no headaches, no neck pain, no rash, no sore throat, no vomiting and no wheezing   Patient is a 45 year old male who presents for upper chest pain, greater on the left, in addition to pain that radiates across his upper shoulders.  He states that he has had similar intermittent symptoms over the past several weeks.  He typically treats his pain at home with over-the-counter pain medications.  He does state that this relieves the pain.  He has not taken anything for his pain this morning.  He denies any recent injuries.  Patient does work in Psychologist, clinical.  He denies any episode of overexertion or straining that initiated to these episodes.  Patient has no known chronic medical conditions other than hypertension.  He does not smoke.  He does have a brother who passed away from a heart attack in his 10s.  Patient denies any other associated symptoms other than some mild discomfort with breathing.  He presents with his wife.  Patient is primarily Spanish-speaking and history was obtained through use of a tele-interpreter. HPI: A 45 year old patient with a history of hypertension and obesity presents for evaluation of chest pain. Initial onset of pain was less than one hour ago. The patient's chest pain is sharp and is not worse with exertion. The patient's chest pain is middle- or left-sided, is not well-localized, is not described as heaviness/pressure/tightness and does not radiate to the  arms/jaw/neck. The patient does not complain of nausea and denies diaphoresis. The patient has a family history of coronary artery disease in a first-degree relative with onset less than age 69. The patient has no history of stroke, has no history of peripheral artery disease, has not smoked in the past 90 days, denies any history of treated diabetes and has no history of hypercholesterolemia.   Past Medical History:  Diagnosis Date   Back pain    Bronchitis    Sinusitis     There are no problems to display for this patient.   History reviewed. No pertinent surgical history.     No family history on file.  Social History   Tobacco Use   Smoking status: Former    Types: Cigarettes   Smokeless tobacco: Never  Substance Use Topics   Alcohol use: No   Drug use: No    Home Medications Prior to Admission medications   Medication Sig Start Date End Date Taking? Authorizing Provider  acetaminophen (TYLENOL 8 HOUR) 650 MG CR tablet Take 1 tablet (650 mg total) by mouth every 8 (eight) hours as needed. 08/02/18   Derwood Kaplan, MD  cetirizine (ZYRTEC ALLERGY) 10 MG tablet Take 1 tablet (10 mg total) by mouth daily. 08/02/18   Derwood Kaplan, MD  dextromethorphan-guaiFENesin (MUCINEX DM) 30-600 MG per 12 hr tablet Take 1 tablet by mouth 2 (two) times daily.    [provider]  guaiFENesin-dextromethorphan (ROBITUSSIN DM) 100-10 MG/5ML syrup Take 20 mLs by mouth every 4 (four) hours as needed  for cough.    [provider]  ibuprofen (ADVIL,MOTRIN) 600 MG tablet Take 1 tablet (600 mg total) by mouth every 6 (six) hours as needed. 08/02/18   Derwood KaplanNanavati, Ankit, MD  lidocaine (LIDODERM) 5 % Place 1 patch onto the skin daily. Remove & Discard patch within 12 hours or as directed by MD 11/22/18   Rolan BuccoBelfi, Melanie, MD  lidocaine (XYLOCAINE) 2 % solution Use as directed 15 mLs in the mouth or throat as needed for mouth pain. 06/09/17   Joy, Shawn C, PA-C  omeprazole (PRILOSEC) 20 MG  capsule Take 1 capsule (20 mg total) by mouth daily. 08/02/18   Derwood KaplanNanavati, Ankit, MD  predniSONE (DELTASONE) 20 MG tablet 3 tabs po day one, then 2 po daily x 4 days 11/22/18   Rolan BuccoBelfi, Melanie, MD    Allergies    Patient has no known allergies.  Review of Systems   Review of Systems  Constitutional:  Negative for activity change, appetite change, chills, fatigue and fever.  HENT:  Negative for ear pain and sore throat.   Eyes:  Negative for pain and visual disturbance.  Respiratory:  Positive for shortness of breath. Negative for cough, chest tightness and wheezing.   Cardiovascular:  Positive for chest pain. Negative for palpitations and leg swelling.  Gastrointestinal:  Negative for abdominal pain, diarrhea, nausea and vomiting.  Genitourinary:  Negative for dysuria and hematuria.  Musculoskeletal:  Negative for arthralgias, back pain, joint swelling, myalgias and neck pain.  Skin:  Negative for color change, rash and wound.  Neurological:  Negative for dizziness, seizures, syncope, weakness, light-headedness, numbness and headaches.  Hematological:  Does not bruise/bleed easily.  All other systems reviewed and are negative.  Physical Exam Updated Vital Signs BP (!) 109/96   Pulse (!) 55   Temp 97.9 F (36.6 C) (Oral)   Resp 14   Ht 6' (1.829 m)   Wt 90.7 kg   SpO2 99%   BMI 27.12 kg/m   Physical Exam Vitals and nursing note reviewed.  Constitutional:      General: He is not in acute distress.    Appearance: He is well-developed. He is not ill-appearing, toxic-appearing or diaphoretic.  HENT:     Head: Normocephalic and atraumatic.     Mouth/Throat:     Mouth: Mucous membranes are moist.     Pharynx: Oropharynx is clear.  Eyes:     Conjunctiva/sclera: Conjunctivae normal.  Cardiovascular:     Rate and Rhythm: Normal rate and regular rhythm.     Heart sounds: No murmur heard. Pulmonary:     Effort: Pulmonary effort is normal. No tachypnea or respiratory distress.      Breath sounds: Normal breath sounds. No decreased breath sounds, wheezing or rales.  Chest:     Chest wall: Tenderness present.  Abdominal:     Palpations: Abdomen is soft.     Tenderness: There is no abdominal tenderness.  Musculoskeletal:        General: Normal range of motion.     Cervical back: Normal range of motion and neck supple.     Right lower leg: No edema.     Left lower leg: No edema.  Skin:    General: Skin is warm and dry.     Capillary Refill: Capillary refill takes less than 2 seconds.  Neurological:     General: No focal deficit present.     Mental Status: He is alert and oriented to person, place, and time.  Cranial Nerves: No cranial nerve deficit.     Motor: No weakness.  Psychiatric:        Mood and Affect: Mood normal.        Behavior: Behavior normal.    ED Results / Procedures / Treatments   Labs (all labs ordered are listed, but only abnormal results are displayed) Labs Reviewed  COMPREHENSIVE METABOLIC PANEL - Abnormal; Notable for the following components:      Result Value   Glucose, Bld 131 (*)    All other components within normal limits  CBC  MAGNESIUM  LIPASE, BLOOD  CK  TROPONIN I (HIGH SENSITIVITY)    EKG EKG Interpretation  Date/Time:  Tuesday June 04 2021 07:08:09 EDT Ventricular Rate:  75 PR Interval:  102 QRS Duration: 108 QT Interval:  386 QTC Calculation: 431 R Axis:   65 Text Interpretation: Sinus rhythm with short PR Minimal voltage criteria for LVH, may be normal variant ( Sokolow-Lyon ) Borderline ECG Confirmed by Gloris Manchester ([id number]) on 06/05/2021 4:19:52 AM  Radiology DG Chest Portable 1 View  Result Date: 06/04/2021 CLINICAL DATA:  45 year old male with chest pain, shortness of breath and back pain. EXAM: PORTABLE CHEST 1 VIEW COMPARISON:  Chest radiographs 11/25/2017. FINDINGS: Portable AP upright view at 0732 hours. Lung volumes and mediastinal contours remain normal. Visualized tracheal air column is within  normal limits. Allowing for portable technique the lungs are clear. No pneumothorax or pleural effusion. No osseous abnormality identified. Paucity of bowel gas in the upper abdomen. IMPRESSION: Negative portable chest. Electronically Signed   By: Odessa Fleming M.D.   On: 06/04/2021 07:52    Procedures Procedures   Medications Ordered in ED Medications  ketorolac (TORADOL) 30 MG/ML injection 30 mg (30 mg Intravenous Given 06/04/21 0802)    ED Course  I have reviewed the triage vital signs and the nursing notes.  Pertinent labs & imaging results that were available during my care of the patient were reviewed by me and considered in my medical decision making (see chart for details).    MDM Rules/Calculators/A&P HEAR Score: 3                         Patient is a 45 year old male with history of hypertension presenting for upper chest pain and upper back pain.  He has had similar symptoms over the past several weeks.  He denies any other chronic medical conditions but, per chart review, does not have any physician encounters in the past year and a half.  Vital signs upon arrival notable for hypertension in the range of 180s over 100s.  This could be secondary to untreated hypertension.  Patient is well-appearing.  On exam, he does have areas of tenderness in his upper chest and upper back, both of which are greater on the left side.  Given the location of the pain and tenderness that is present, I do suspect a musculoskeletal etiology.  Will treat symptomatically with Toradol.  Given his risk factors and EKG, patient's hear score is a hear score of 3.  Will obtain labs, including troponin.  If lab work reassuring, patient can be discharged.  Patient would benefit from establishing primary care for management of hypertension.  Lab work is reassuring.  Patient reported resolution of pain following Toradol.  With his improved pain, his elevated blood pressure also resolved.  Contact information was provided to  establish primary care.  Patient was advised to, if possible, limit physical  exertion at work that worsens musculoskeletal pain.  Patient was discharged in good condition.  Final Clinical Impression(s) / ED Diagnoses Final diagnoses:  Chest pain, unspecified type    Rx / DC Orders ED Discharge Orders     None        Gloris Manchester, MD 06/05/21 [phone]

## 2021-06-04 NOTE — ED Triage Notes (Signed)
Reports shortness of breath and back pain.  Also complains of pain inchest and in between shoulder blades.

## 2021-10-23 ENCOUNTER — Other Ambulatory Visit: Payer: Self-pay

## 2021-10-23 ENCOUNTER — Emergency Department (HOSPITAL_BASED_OUTPATIENT_CLINIC_OR_DEPARTMENT_OTHER): Payer: Self-pay

## 2021-10-23 ENCOUNTER — Emergency Department (HOSPITAL_BASED_OUTPATIENT_CLINIC_OR_DEPARTMENT_OTHER)
Admission: EM | Admit: 2021-10-23 | Discharge: 2021-10-23 | Disposition: A | Discharge status: Home / Self Care | Payer: Self-pay | Attending: Emergency Medicine | Admitting: Emergency Medicine

## 2021-10-23 ENCOUNTER — Encounter (HOSPITAL_BASED_OUTPATIENT_CLINIC_OR_DEPARTMENT_OTHER): Payer: Self-pay | Admitting: Emergency Medicine

## 2021-10-23 DIAGNOSIS — M25561 Pain in right knee: Secondary | ICD-10-CM

## 2021-10-23 DIAGNOSIS — T148XXA Other injury of unspecified body region, initial encounter: Secondary | ICD-10-CM

## 2021-10-23 DIAGNOSIS — W228XXA Striking against or struck by other objects, initial encounter: Secondary | ICD-10-CM | POA: Insufficient documentation

## 2021-10-23 DIAGNOSIS — S8001XA Contusion of right knee, initial encounter: Secondary | ICD-10-CM | POA: Insufficient documentation

## 2021-10-23 DIAGNOSIS — R0981 Nasal congestion: Secondary | ICD-10-CM | POA: Insufficient documentation

## 2021-10-23 DIAGNOSIS — Z87891 Personal history of nicotine dependence: Secondary | ICD-10-CM | POA: Insufficient documentation

## 2021-10-23 MED ORDER — DEXAMETHASONE SODIUM PHOSPHATE 10 MG/ML IJ SOLN
10.0000 mg | Freq: Once | INTRAMUSCULAR | Status: AC
  Administered 2021-10-23: 10:00:00 10 mg via INTRAMUSCULAR
  Filled 2021-10-23: qty 1

## 2021-10-23 NOTE — Discharge Instructions (Signed)
You can use an Ace wrap to the area.   Follow-up with Dr. Jordan Likes if your symptoms or not improving.  Use Flonase for your sinus congestion.  Follow-up with a primary care doctor if you have no improvement of symptoms over the next week.  Return to the emergency room if you have any worsening symptoms.

## 2021-10-23 NOTE — ED Triage Notes (Signed)
Pt arrives pov with c/o right knee injury x 2 weeks pta, endorses accidental injury by hammer to knee. Swelling and tenderness noted.
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
## 2022-03-14 ENCOUNTER — Ambulatory Visit: Payer: Self-pay | Admitting: Family Medicine
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
## 2022-05-07 ENCOUNTER — Emergency Department (HOSPITAL_BASED_OUTPATIENT_CLINIC_OR_DEPARTMENT_OTHER)
Admission: EM | Admit: 2022-05-07 | Discharge: 2022-05-07 | Disposition: A | Discharge status: Home / Self Care | Payer: Self-pay | Attending: Emergency Medicine | Admitting: Emergency Medicine

## 2022-05-07 ENCOUNTER — Other Ambulatory Visit: Payer: Self-pay

## 2022-05-07 ENCOUNTER — Emergency Department (HOSPITAL_BASED_OUTPATIENT_CLINIC_OR_DEPARTMENT_OTHER): Payer: Self-pay

## 2022-05-07 ENCOUNTER — Encounter (HOSPITAL_BASED_OUTPATIENT_CLINIC_OR_DEPARTMENT_OTHER): Payer: Self-pay

## 2022-05-07 ENCOUNTER — Other Ambulatory Visit (HOSPITAL_BASED_OUTPATIENT_CLINIC_OR_DEPARTMENT_OTHER): Payer: Self-pay

## 2022-05-07 DIAGNOSIS — J069 Acute upper respiratory infection, unspecified: Secondary | ICD-10-CM | POA: Insufficient documentation

## 2022-05-07 DIAGNOSIS — Z87891 Personal history of nicotine dependence: Secondary | ICD-10-CM | POA: Insufficient documentation

## 2022-05-07 DIAGNOSIS — Z20822 Contact with and (suspected) exposure to covid-19: Secondary | ICD-10-CM | POA: Insufficient documentation

## 2022-05-07 DIAGNOSIS — R052 Subacute cough: Secondary | ICD-10-CM

## 2022-05-07 LAB — RESP PANEL BY RT-PCR (FLU A&B, COVID) ARPGX2
Influenza A by PCR: NEGATIVE
Influenza B by PCR: NEGATIVE
SARS Coronavirus 2 by RT PCR: NEGATIVE

## 2022-05-07 MED ORDER — FLUTICASONE PROPIONATE 50 MCG/ACT NA SUSP
1.0000 | Freq: Every day | NASAL | 0 refills | Status: DC
  Filled 2022-05-07: qty 16, 7d supply, fill #0

## 2022-05-07 MED ORDER — BENZONATATE 100 MG PO CAPS
100.0000 mg | ORAL_CAPSULE | Freq: Three times a day (TID) | ORAL | 0 refills | Status: DC | PRN
  Filled 2022-05-07: qty 21, 7d supply, fill #0

## 2022-05-07 MED ORDER — GUAIFENESIN-CODEINE 100-10 MG/5ML PO SOLN
5.0000 mL | Freq: Every evening | ORAL | 0 refills | Status: DC | PRN
  Filled 2022-05-07: qty 118, 23d supply, fill #0

## 2022-05-07 MED ORDER — ALBUTEROL SULFATE HFA 108 (90 BASE) MCG/ACT IN AERS
1.0000 | INHALATION_SPRAY | Freq: Four times a day (QID) | RESPIRATORY_TRACT | Status: DC
  Administered 2022-05-07: 2 via RESPIRATORY_TRACT
  Filled 2022-05-07: qty 6.7

## 2022-05-07 MED ORDER — LACTATED RINGERS IV BOLUS
1000.0000 mL | Freq: Once | INTRAVENOUS | Status: AC
  Administered 2022-05-07: 1000 mL via INTRAVENOUS

## 2022-05-07 MED ORDER — KETOROLAC TROMETHAMINE 30 MG/ML IJ SOLN
30.0000 mg | Freq: Once | INTRAMUSCULAR | Status: AC
  Administered 2022-05-07: 30 mg via INTRAVENOUS
  Filled 2022-05-07: qty 1

## 2022-05-07 NOTE — Discharge Instructions (Addendum)
It was a pleasure caring for you today in the emergency department. ° °Please return to the emergency department for any worsening or worrisome symptoms. ° ° °

## 2022-05-07 NOTE — ED Triage Notes (Signed)
Pt spanish speaking. Reports possible bronchitis for past 3 weeks. Intermittent SOB and cough. Chest heaviness for 1 week. Ambulatory sats by RT 98% on RA

## 2022-05-07 NOTE — ED Notes (Signed)
Patient ambulated to room on pulse ox. No SOB noted. SAT 98, HR 97

## 2022-05-07 NOTE — ED Provider Notes (Signed)
[facility] EMERGENCY DEPARTMENT Provider Note   CSN: [id number] Arrival date & time: 05/07/22  2130     History  Chief Complaint  Patient presents with   Cough   Chest Pain    Parker Lambert is a 46 y.o. male.  Patient as above with significant medical history as below, including bronchitis, sinusitis who presents to the ED with complaint of cough, congestion, low-grade fever, poor appetite.  Patient reports onset of symptoms 1.5 to 2 weeks ago.  Spouse with similar complaints but her symptoms have resolved.  No significant proved with OTC cough and cold remedies.  Nonproductive cough.  Body aches.  No change in bowel or bladder function.  Poor appetite but is able to tolerate p.o. intake.  No tobacco use, no alcohol use, no illicit drug use.  Positive secondhand smoke exposure from spouse  Patient offered interpreter but he prefers to have spouse interpret for him, interpreter available if he changes mind     Past Medical History:  Diagnosis Date   Back pain    Bronchitis    Sinusitis     History reviewed. No pertinent surgical history.   The history is provided by the patient and the spouse. The history is limited by a language barrier. No language interpreter was used (Patient prefers to have spouse interpret for him).  Cough Associated symptoms: chest pain and rhinorrhea   Associated symptoms: no chills, no fever, no headaches, no rash and no shortness of breath   Chest Pain Associated symptoms: cough   Associated symptoms: no abdominal pain, no dysphagia, no fever, no headache, no nausea, no palpitations, no shortness of breath and no vomiting        Home Medications Prior to Admission medications   Medication Sig Start Date End Date Taking? Authorizing Provider  acetaminophen (TYLENOL 8 HOUR) 650 MG CR tablet Take 1 tablet (650 mg total) by mouth every 8 (eight) hours as needed. 08/02/18   Derwood Kaplan, MD  cetirizine (ZYRTEC ALLERGY) 10  MG tablet Take 1 tablet (10 mg total) by mouth daily. 08/02/18   Derwood Kaplan, MD  dextromethorphan-guaiFENesin (MUCINEX DM) 30-600 MG per 12 hr tablet Take 1 tablet by mouth 2 (two) times daily.    [provider]  guaiFENesin-dextromethorphan (ROBITUSSIN DM) 100-10 MG/5ML syrup Take 20 mLs by mouth every 4 (four) hours as needed for cough.    [provider]  ibuprofen (ADVIL,MOTRIN) 600 MG tablet Take 1 tablet (600 mg total) by mouth every 6 (six) hours as needed. 08/02/18   Derwood Kaplan, MD  lidocaine (LIDODERM) 5 % Place 1 patch onto the skin daily. Remove & Discard patch within 12 hours or as directed by MD 11/22/18   Rolan Bucco, MD  lidocaine (XYLOCAINE) 2 % solution Use as directed 15 mLs in the mouth or throat as needed for mouth pain. 06/09/17   Joy, Shawn C, PA-C  omeprazole (PRILOSEC) 20 MG capsule Take 1 capsule (20 mg total) by mouth daily. 08/02/18   Derwood Kaplan, MD  predniSONE (DELTASONE) 20 MG tablet 3 tabs po day one, then 2 po daily x 4 days 11/22/18   Rolan Bucco, MD      Allergies    Patient has no known allergies.    Review of Systems   Review of Systems  Constitutional:  Positive for appetite change. Negative for chills and fever.  HENT:  Positive for congestion, postnasal drip and rhinorrhea. Negative for facial swelling and trouble swallowing.   Eyes:  Negative for photophobia and visual disturbance.  Respiratory:  Positive for cough. Negative for shortness of breath.   Cardiovascular:  Positive for chest pain. Negative for palpitations.  Gastrointestinal:  Negative for abdominal pain, nausea and vomiting.  Endocrine: Negative for polydipsia and polyuria.  Genitourinary:  Negative for difficulty urinating and hematuria.  Musculoskeletal:  Positive for arthralgias. Negative for gait problem and joint swelling.  Skin:  Negative for pallor and rash.  Neurological:  Negative for syncope and headaches.  Psychiatric/Behavioral:  Negative for  agitation and confusion.     Physical Exam Updated Vital Signs BP (!) 150/98   Pulse (!) 55   Temp 98.3 F (36.8 C)   Resp 11   Ht 6' (1.829 m)   Wt 86.2 kg   SpO2 99%   BMI 25.77 kg/m  Physical Exam Vitals and nursing note reviewed.  Constitutional:      General: He is not in acute distress.    Appearance: He is well-developed. He is not ill-appearing or diaphoretic.  HENT:     Head: Normocephalic and atraumatic.     Right Ear: External ear normal.     Left Ear: External ear normal.     Mouth/Throat:     Mouth: Mucous membranes are moist.  Eyes:     General: No scleral icterus. Cardiovascular:     Rate and Rhythm: Normal rate and regular rhythm.     Pulses: Normal pulses.     Heart sounds: Normal heart sounds.     No S3 or S4 sounds.  Pulmonary:     Effort: Pulmonary effort is normal. No tachypnea or respiratory distress.     Breath sounds: Normal breath sounds. No stridor. No wheezing or rales.  Abdominal:     General: Abdomen is flat.     Palpations: Abdomen is soft.     Tenderness: There is no abdominal tenderness. There is no guarding or rebound.  Musculoskeletal:        General: Normal range of motion.     Cervical back: Normal range of motion.     Right lower leg: No edema.     Left lower leg: No edema.  Skin:    General: Skin is warm and dry.     Capillary Refill: Capillary refill takes less than 2 seconds.  Neurological:     Mental Status: He is alert and oriented to person, place, and time.     GCS: GCS eye subscore is 4. GCS verbal subscore is 5. GCS motor subscore is 6.  Psychiatric:        Mood and Affect: Mood normal.        Behavior: Behavior normal.     ED Results / Procedures / Treatments   Labs (all labs ordered are listed, but only abnormal results are displayed) Labs Reviewed  RESP PANEL BY RT-PCR (FLU A&B, COVID) [id number]    EKG EKG Interpretation  Date/Time:  Wednesday May 07 2022 08:21:27 EDT Ventricular Rate:  69 PR  Interval:  120 QRS Duration: 90 QT Interval:  378 QTC Calculation: 405 R Axis:   63 Text Interpretation: Sinus rhythm Consider left ventricular hypertrophy similar to prior no stemi Confirmed by Tanda Rockers ([id number]) on 05/07/2022 9:43:42 AM  Radiology DG Chest Portable 1 View  Result Date: 05/07/2022 CLINICAL DATA:  cough possible bronchitis for 3 weeks intermittent shortness of breath EXAM: PORTABLE CHEST 1 VIEW COMPARISON:  June 04, 2021 FINDINGS: The heart size and mediastinal contours are within normal limits. Both  lungs are clear and stable. The visualized skeletal structures are unremarkable. IMPRESSION: No active disease. Electronically Signed   By: Marjo Bicker M.D.   On: 05/07/2022 08:56    Procedures Procedures    Medications Ordered in ED Medications  ketorolac (TORADOL) 30 MG/ML injection 30 mg (30 mg Intravenous Given 05/07/22 0900)  lactated ringers bolus 1,000 mL (1,000 mLs Intravenous New Bag/Given 05/07/22 2229)    ED Course/ Medical Decision Making/ A&P                           Medical Decision Making Amount and/or Complexity of Data Reviewed Radiology: ordered.  Risk Prescription drug management.    CC: cough/ congestion  This patient presents to the Emergency Department for the above complaint. This involves an extensive number of treatment options and is a complaint that carries with it a high risk of complications and morbidity. Vital signs were reviewed. Serious etiologies considered.  Record review:  Previous records obtained and reviewed prior ED visits, prior labs and imaging, prior office visit in urgent care.  Home medications  Additional history obtained from spouse  Medical and surgical history as noted above.   Work up as above, notable for:  Labs & imaging results that were available during my care of the patient were visualized by me and considered in my medical decision making.  Physical exam as above.   I ordered imaging studies  which included chest x-ray. I visualized the imaging, interpreted images, and I agree with radiologist interpretation.  No large pneumothorax or effusion.  No acute process  Cardiac monitoring reviewed and interpreted personally which shows NSR   Viral swab negative  Management: IV fluids, Toradol  ED Course:     Reassessment:  Patient report significant treatment to his symptoms following intervention.  Favor likely viral URI as etiology for  complaints today.  Start patient on antitussive, give albuterol inhaler as needed, close follow-up PCP.  Admission was considered.    The patient improved significantly and was discharged in stable condition. Detailed discussions were had with the patient regarding current findings, and need for close f/u with PCP or on call doctor. The patient has been instructed to return immediately if the symptoms worsen in any way for re-evaluation. Patient verbalized understanding and is in agreement with current care plan. All questions answered prior to discharge.                Social determinants of health include -  Social History   Socioeconomic History   Marital status: Married    Spouse name: Not on file   Number of children: Not on file   Years of education: Not on file   Highest education level: Not on file  Occupational History   Not on file  Tobacco Use   Smoking status: Former    Types: Cigarettes   Smokeless tobacco: Never  Substance and Sexual Activity   Alcohol use: No   Drug use: Not Currently    Comment: CBD   Sexual activity: Not on file  Other Topics Concern   Not on file  Social History Narrative   Not on file   Social Determinants of Health   Financial Resource Strain: Not on file  Food Insecurity: Not on file  Transportation Needs: Not on file  Physical Activity: Not on file  Stress: Not on file  Social Connections: Not on file  Intimate Partner Violence: Not on file  This chart was  dictated using voice recognition software.  Despite best efforts to proofread,  errors can occur which can change the documentation meaning.         Final Clinical Impression(s) / ED Diagnoses Final diagnoses:  Subacute cough  Upper respiratory tract infection, unspecified type    Rx / DC Orders ED Discharge Orders     None         Sloan Leiter, DO 05/07/22 1002

## 2022-07-14 ENCOUNTER — Ambulatory Visit: Payer: Self-pay | Admitting: Family Medicine

## 2023-02-11 ENCOUNTER — Encounter: Payer: Self-pay | Admitting: *Deleted

## 2023-09-25 ENCOUNTER — Encounter (HOSPITAL_BASED_OUTPATIENT_CLINIC_OR_DEPARTMENT_OTHER): Payer: Self-pay

## 2023-09-25 ENCOUNTER — Other Ambulatory Visit: Payer: Self-pay

## 2023-09-25 ENCOUNTER — Emergency Department (HOSPITAL_BASED_OUTPATIENT_CLINIC_OR_DEPARTMENT_OTHER)
Admission: EM | Admit: 2023-09-25 | Discharge: 2023-09-25 | Disposition: A | Discharge status: Home / Self Care | Payer: Self-pay | Attending: Emergency Medicine | Admitting: Emergency Medicine

## 2023-09-25 DIAGNOSIS — R052 Subacute cough: Secondary | ICD-10-CM | POA: Insufficient documentation

## 2023-09-25 DIAGNOSIS — R030 Elevated blood-pressure reading, without diagnosis of hypertension: Secondary | ICD-10-CM | POA: Insufficient documentation

## 2023-09-25 MED ORDER — ALBUTEROL SULFATE HFA 108 (90 BASE) MCG/ACT IN AERS: 2.0000 | INHALATION_SPRAY | RESPIRATORY_TRACT | 0 refills | Status: AC | PRN

## 2023-09-25 NOTE — ED Provider Notes (Signed)
[place] EMERGENCY DEPARTMENT AT [facility]  Provider Note  CSN: [id number] Arrival date & time: 09/25/23 2725  History Chief Complaint  Patient presents with   Cough   History via Video Interpreter  Parker Lambert is a 47 y.o. male with no known PMH reports about a month of intermittent dry cough and sinus pressure. No fever. No SOB or CP. His wife made him come to the ED this morning. He has had prior episodes of bronchitis improved with inhaler and is requesting one today. He does not smoke, but his wife does.    Home Medications Prior to Admission medications   Medication Sig Start Date End Date Taking? Authorizing Provider  albuterol (VENTOLIN HFA) 108 (90 Base) MCG/ACT inhaler Inhale 2 puffs into the lungs every 4 (four) hours as needed for wheezing or shortness of breath. 09/25/23  Yes Pollyann Savoy, MD     Allergies    Patient has no known allergies.   Review of Systems   Review of Systems Please see HPI for pertinent positives and negatives  Physical Exam BP (!) 187/112 (BP Location: Right Arm)   Pulse 74   Temp 97.6 F (36.4 C)   Resp 16   Ht 6' (1.829 m)   Wt 81.6 kg   SpO2 100%   BMI 24.41 kg/m   Physical Exam Vitals and nursing note reviewed.  Constitutional:      Appearance: Normal appearance.  HENT:     Head: Normocephalic and atraumatic.     Nose: Nose normal.     Mouth/Throat:     Mouth: Mucous membranes are moist.  Eyes:     Extraocular Movements: Extraocular movements intact.     Conjunctiva/sclera: Conjunctivae normal.  Cardiovascular:     Rate and Rhythm: Normal rate.  Pulmonary:     Effort: Pulmonary effort is normal.     Breath sounds: Normal breath sounds.  Abdominal:     General: Abdomen is flat.     Palpations: Abdomen is soft.     Tenderness: There is no abdominal tenderness.  Musculoskeletal:        General: No swelling. Normal range of motion.     Cervical back: Neck supple.  Skin:     General: Skin is warm and dry.  Neurological:     General: No focal deficit present.     Mental Status: He is alert.  Psychiatric:        Mood and Affect: Mood normal.     ED Results / Procedures / Treatments   EKG None  Procedures Procedures  Medications Ordered in the ED Medications - No data to display  Initial Impression and Plan  Patient here with subacute cough, exam is benign. Vitals reassuring except for elevated BP. Patient reports he gets nervous in the ED but does not check his BP at home, so unclear what his baseline is. He is not having any symptoms concerning for acute end organ damage. Will give Rx for inhaler. No indication for labs or imaging in the ED. Advised to check BP at home when he is relaxed and if remains elevated, recommend PCP follow up. RTED for any other concerns.   ED Course       MDM Rules/Calculators/A&P Medical Decision Making Problems Addressed: Elevated blood pressure reading: undiagnosed new problem with uncertain prognosis Subacute cough: self-limited or minor problem  Risk Prescription drug management.     Final Clinical Impression(s) / ED Diagnoses Final diagnoses:  Subacute cough  Elevated blood pressure reading    Rx / DC Orders ED Discharge Orders          Ordered    albuterol (VENTOLIN HFA) 108 (90 Base) MCG/ACT inhaler  Every 4 hours PRN        09/25/23 0640             Pollyann Savoy, MD 09/25/23 [phone]

## 2023-09-25 NOTE — ED Triage Notes (Signed)
Pt states he needs an inhaler and also has had a cough and sinus issues x1 month.
